# Patient Record
Sex: Male | Born: 1935 | Race: White | Hispanic: No | Smoking: Former smoker
Health system: Southern US, Community
[De-identification: ages and names within clinical notes are randomized; demographics above are authoritative.]

## PROBLEM LIST (undated history)

## (undated) DIAGNOSIS — R238 Other skin changes: Secondary | ICD-10-CM

## (undated) DIAGNOSIS — M1990 Unspecified osteoarthritis, unspecified site: Secondary | ICD-10-CM

## (undated) DIAGNOSIS — R39198 Other difficulties with micturition: Secondary | ICD-10-CM

## (undated) DIAGNOSIS — C801 Malignant (primary) neoplasm, unspecified: Secondary | ICD-10-CM

## (undated) DIAGNOSIS — R059 Cough, unspecified: Secondary | ICD-10-CM

## (undated) DIAGNOSIS — R05 Cough: Secondary | ICD-10-CM

## (undated) DIAGNOSIS — I10 Essential (primary) hypertension: Secondary | ICD-10-CM

## (undated) DIAGNOSIS — R062 Wheezing: Secondary | ICD-10-CM

## (undated) DIAGNOSIS — R1310 Dysphagia, unspecified: Secondary | ICD-10-CM

## (undated) DIAGNOSIS — N189 Chronic kidney disease, unspecified: Secondary | ICD-10-CM

## (undated) DIAGNOSIS — R233 Spontaneous ecchymoses: Secondary | ICD-10-CM

## (undated) DIAGNOSIS — R319 Hematuria, unspecified: Secondary | ICD-10-CM

## (undated) DIAGNOSIS — R0981 Nasal congestion: Secondary | ICD-10-CM

## (undated) HISTORY — DX: Hematuria, unspecified: R31.9

## (undated) HISTORY — PX: LITHOTRIPSY: SUR834

## (undated) HISTORY — PX: OTHER SURGICAL HISTORY: SHX169

## (undated) HISTORY — DX: Cough, unspecified: R05.9

## (undated) HISTORY — DX: Dysphagia, unspecified: R13.10

## (undated) HISTORY — DX: Spontaneous ecchymoses: R23.3

## (undated) HISTORY — DX: Cough: R05

## (undated) HISTORY — DX: Wheezing: R06.2

## (undated) HISTORY — DX: Other skin changes: R23.8

## (undated) HISTORY — DX: Other difficulties with micturition: R39.198

## (undated) HISTORY — DX: Unspecified osteoarthritis, unspecified site: M19.90

## (undated) HISTORY — DX: Nasal congestion: R09.81

---

## 2006-07-21 ENCOUNTER — Encounter: Admission: RE | Admit: 2006-07-21 | Discharge: 2006-07-21 | Payer: Self-pay | Admitting: Urology

## 2006-07-24 ENCOUNTER — Ambulatory Visit (HOSPITAL_BASED_OUTPATIENT_CLINIC_OR_DEPARTMENT_OTHER): Admission: RE | Admit: 2006-07-24 | Discharge: 2006-07-24 | Payer: Self-pay | Admitting: Urology

## 2007-01-22 HISTORY — PX: CYSTOSCOPY W/ URETERAL STENT PLACEMENT: SHX1429

## 2007-08-31 ENCOUNTER — Ambulatory Visit (HOSPITAL_COMMUNITY): Admission: RE | Admit: 2007-08-31 | Discharge: 2007-08-31 | Payer: Self-pay | Admitting: Urology

## 2007-09-10 ENCOUNTER — Ambulatory Visit (HOSPITAL_COMMUNITY): Admission: RE | Admit: 2007-09-10 | Discharge: 2007-09-10 | Payer: Self-pay | Admitting: Urology

## 2010-06-05 NOTE — Op Note (Signed)
NAMENATAN, HARTOG               ACCOUNT NO.:  [id number]   MEDICAL RECORD NO.:  [id number]          PATIENT TYPE:  AMB   LOCATION:  [facility]                         FACILITY:  [facility]   PHYSICIAN:  Excell Seltzer. Annabell Howells, M.D.    DATE OF BIRTH:  Sep 15, 1933   DATE OF PROCEDURE:  07/24/2006  DATE OF DISCHARGE:                               OPERATIVE REPORT   PREOPERATIVE DIAGNOSIS:  Right distal ureteral stone.   POSTOPERATIVE DIAGNOSIS:  Right distal ureteral stone.   PROCEDURE PERFORMED:  1. Cystoscopy.  2. Dilation of ureteral orifice.  3. Right ureteroscopy with laser lithotripsy, basket stone extraction.   SURGEON:  Bjorn Pippin, MD   ASSISTANT:  Tarri Glenn   ANESTHESIA:  General LMA.   DRAINS:  6 x 26 double-J stent with tether.   COMPLICATIONS:  None.   INDICATIONS FOR PROCEDURE:  Mr. Dutter is a 77 year old male with a 6  mm distal right ureteral stone.  He is symptomatic and has failed  conservative management.  He presents for definitive stone management.   DESCRIPTION OF PROCEDURE IN DETAIL:  The patient was brought to the  operating room.  He was identified by his arm band.  Informed consent  was verified, and preoperative time-out was performed.  After the  successful induction of general LMA anesthesia, the patient was moved to  the dorsal lithotomy position.  All appropriate pressure points were  padded to avoid __________ compartment syndrome.  Sequential compression  devices were employed.   Perioperative antibiotics were administered.  The perineum was prepped  and draped.  Surgeon changes gown and gloves.  A 22 French cystoscopic  sheath was used to introduce 12-degree cystoscope instrument directly  into the bladder.  Then 12 and 70 degree lenses were used to perform  pancystoscopic urethroscopy.  Prostatic urethra showed mild bilobar  hypertrophy.  Upon entering the bladder, bilateral ureteral orifices  were noted to be normal anatomic position along the tract.   The left was  seen to efflux clear urine.  The remainder of the bladder was inspected,  and it was free of any mucosal lesions, erythema, foreign body.  There  were several small cellules.  There were no papillary lesions.  We then  turned our attention to the right ureteral orifice.  It was cannulated  with a guidewire.  This was advanced under fluoroscopy into the renal  pelvis.  The cystoscope was removed.  A 12 French access sheath was used  to dilate the ureteral orifice.  We then inserted a semi-rigid  ureteroscope, 6 [place] in caliber.  This was driven under direct vision  into the distal ureter.  There, we visualized a 6 mm stone.  Holmium  laser fiber, 270 microns, was inserted via the working port of the  scope, and laser lithotripsy was carried out with settings at 0.8 joules  at 8 Hz for a power of 6.4 watts.  Once the stone had been fragmented  into 2 approximately 3 mm pieces, the laser was removed.  A nitinol  basket was used to grasp the stone, and each was extracted  and sent for  stone analysis.  The ureteroscope was removed.  A 6 x 26 tethered double-  J stent was advanced over the wire under fluoroscopic control.  It was  constantly seen within the renal pelvis.  The wire was removed.  Excellent proximal and distal curls were seen.  The bladder was drained.  The tether was secured.  At this time, the procedure was terminated.  The patient tolerated the procedure well, and there were no  complications.  Bjorn Pippin was the attending physician and was  participating in all aspects of the procedure.   DISPOSITION:  The patient was awoken uneventfully from anesthesia and  transported safely to the PACU.     ______________________________  Terie Purser, MD      Excell Seltzer. Annabell Howells, M.D.  Electronically Signed    JH/MEDQ  D:  07/24/2006  T:  07/24/2006  Job:  [id number]

## 2010-11-06 LAB — POCT HEMOGLOBIN-HEMACUE
Hemoglobin: 16.3
Operator id: 134391

## 2010-11-07 LAB — BASIC METABOLIC PANEL
BUN: 12
Chloride: 101
GFR calc Af Amer: >60
GFR calc non Af Amer: >60
Potassium: 4.9
Sodium: 140

## 2010-11-26 ENCOUNTER — Encounter (HOSPITAL_COMMUNITY): Payer: Self-pay | Admitting: Pharmacy Technician

## 2010-11-29 NOTE — Progress Notes (Signed)
Pt allergic to [facility] . Omitted per Dr. Annabell Howells

## 2010-12-04 ENCOUNTER — Encounter (HOSPITAL_COMMUNITY): Payer: Self-pay

## 2010-12-04 NOTE — Progress Notes (Addendum)
No aspirin, aleve , or toradol until after litho No fish oil until after litho. Take laxative as instructed in blue folder

## 2010-12-05 NOTE — H&P (Signed)
Problems  1. Asymptomatic Hyperuricemia 790.6 2. Benign Prostatic Hypertrophy With Urinary Obstruction 600.01 3. Microscopic Hematuria 599.72 4. Nephrolithiasis Of Both Kidneys 592.0 5. Organic Impotence 607.84 6. 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 PSA,Elevated 790.93 8. Ureteral Stone 592.1  History of Present Illness  Alex Cherry returns today for right ureteral ESWL.   He was in initially on 8/17 with hematuria and right flank pain and was found to have an 8mm ureteral stone.  He was seen on 9/5 and the stone had moved to the RUVJ.   He some right flank discomfort, but no pain.   A KUB today shows no change in the position of the stone.  He has a large LUP stone that is unchanged.   Past Medical History Problems  1. History of  Abdominal Pain In The Right Lower Belly (RLQ) 789.03 2. History of  Anxiety (Symptom) 300.00 3. History of  Arthritis V13.4 4. History of  Asthma 493.90 5. History of  Esophageal Reflux 530.81 6. History of  Hypercholesterolemia 272.0 7. History of  Hypertension 401.9 8. History of  Nephrolithiasis V13.01 9. History of  Ureteral Stone 592.1 10. History of  Urge Incontinence Of Urine 788.31  Surgical History Problems  1. History of  Cystoscopy With Ureteroscopy Right 2. History of  Hand Surgery 3. History of  Lithotripsy 4. History of  Pilonidal Cyst Resection  Current Meds 1. Aleve TABS; Therapy: (Recorded:27Jun2008) to 2. Alfuzosin HCl ER 10 MG Oral Tablet Extended Release 24 Hour; 1HS - TAKE ONE TABLET BY  MOUTH AT BEDTIME; Therapy: 17Aug2012 to (Last Rx:17Aug2012)  Requested for: 17Aug2012 3. Diltiazem HCl TABS; Therapy: (Recorded:27Jun2008) to 4. Fish Oil CAPS; Therapy: (Recorded:27Jun2008) to 5. Hydrocodone-Acetaminophen 7.5-325 MG Oral Tablet; TAKE 1 TO 2 TABLETS EVERY 4 TO 6  HOURS AS NEEDED FOR PAIN; Therapy: 17Aug2012 to (Evaluate:21Aug2012); Last  Rx:17Aug2012 6. Tylenol CAPS; Therapy: (Recorded:27Jun2008)  to  Allergies Medication  1. Septra SOLN 2. Benadryl CAPS 3. Penicillins  Family History Problems  1. Paternal history of  Acute Myocardial Infarction V17.3 2. Paternal history of  Alcoholism 3. Paternal history of  Death In The Family Father MIAge of death was 38 4. Maternal history of  Death In The Family Mother Lenn Cal of death was 29 5. Family history of  Family Health Status Number Of Children One son and one daughter  Social History Problems  1. Alcohol Use Socially 2. Marital History - Currently Married 3. Retired From Work 4. History of  Tobacco Use V15.82 Denied  5. History of  Caffeine Use  Review of Systems  Gastrointestinal: no nausea.  Constitutional: no fever.    Vitals Vital Signs [Data Includes: Last 1 Day]  10Oct2012 08:24AM  Blood Pressure: 149 / 78 Temperature: 97.5 F Heart Rate: 60  Physical Exam Constitutional: Well nourished and well developed . No acute distress.  Pulmonary: No respiratory distress and normal respiratory rhythm and effort.  Cardiovascular: Heart rate and rhythm are normal . No peripheral edema.    Results/Data Urine [Data Includes: Last 1 Day]  10Oct2012  COLOR: YELLOW  Reference Range YELLOW APPEARANCE: CLEAR  Reference Range CLEAR SPECIFIC GRAVITY: 1.030  Reference Range 1.005-1.030 pH: 5.5  Reference Range 5.0-8.0 GLUCOSE: NEG mg/dL Reference Range NEG BILIRUBIN: NEG  Reference Range NEG KETONE: NEG mg/dL Reference Range NEG BLOOD: LARGE  Abnormal Reference Range NEG PROTEIN: NEG mg/dL Reference Range NEG UROBILINOGEN: 0.2 mg/dL Reference Range 1.6-1.0 NITRITE: NEG  Reference Range NEG LEUKOCYTE ESTERASE: NEG  Reference  Range NEG SQUAMOUS EPITHELIAL/HPF: RARE  Reference Range RARE WBC: 4-6 WBC/hpf Abnormal Reference Range <4 RBC: 7-10 RBC/hpf Abnormal Reference Range <4 BACTERIA: RARE  Reference Range RARE CRYSTALS: NONE SEEN  Reference Range NEG CASTS: NONE SEEN  Reference Range NEG  The  following images/tracing/specimen were independently visualized:  KUB today shows a stable RUVJ stone and a stable LUP stone. There are no other changes in the films.    Assessment Assessed  1. Ureteral Stone 592.1 2. Nephrolithiasis Of Both Kidneys 592.0   His stone hasn't moved in the last 5 weeks.   Plan Health Maintenance (V70.0)  1. UA With REFLEX  Done: 10Oct2012 08:09AM   ESWL today for his RUVJ stone.   I reviewed the risks including but not limited to bleeding, infection, injury to adjacent structures, failure of the procedure, thrombotic events and sedation risks.  He probability of success is about 75-80%.   Discussion/Summary  CC: Dr. Windle Guard.

## 2010-12-06 ENCOUNTER — Encounter (HOSPITAL_COMMUNITY)
Admission: RE | Disposition: A | Discharge status: Home / Self Care | Payer: Self-pay | Source: Ambulatory Visit | Attending: Urology

## 2010-12-06 ENCOUNTER — Ambulatory Visit (HOSPITAL_COMMUNITY): Payer: Medicare Other

## 2010-12-06 ENCOUNTER — Encounter (HOSPITAL_COMMUNITY): Payer: Self-pay | Admitting: *Deleted

## 2010-12-06 ENCOUNTER — Ambulatory Visit (HOSPITAL_COMMUNITY)
Admission: RE | Admit: 2010-12-06 | Discharge: 2010-12-06 | Disposition: A | Discharge status: Home / Self Care | Payer: Medicare Other | Source: Ambulatory Visit | Attending: Urology | Admitting: Urology

## 2010-12-06 DIAGNOSIS — N201 Calculus of ureter: Secondary | ICD-10-CM | POA: Diagnosis present

## 2010-12-06 DIAGNOSIS — J45909 Unspecified asthma, uncomplicated: Secondary | ICD-10-CM | POA: Insufficient documentation

## 2010-12-06 DIAGNOSIS — Z79899 Other long term (current) drug therapy: Secondary | ICD-10-CM | POA: Insufficient documentation

## 2010-12-06 DIAGNOSIS — I10 Essential (primary) hypertension: Secondary | ICD-10-CM | POA: Insufficient documentation

## 2010-12-06 HISTORY — DX: Essential (primary) hypertension: I10

## 2010-12-06 SURGERY — LITHOTRIPSY, ESWL
Anesthesia: LOCAL | Laterality: Right

## 2010-12-06 MED ORDER — DEXTROSE-NACL 5-0.45 % IV SOLN
INTRAVENOUS | Status: DC
  Administered 2010-12-06: 07:00:00 via INTRAVENOUS

## 2010-12-06 MED ORDER — HYDROCODONE-ACETAMINOPHEN 5-325 MG PO TABS: 1.0000 | ORAL_TABLET | ORAL | Status: AC | PRN

## 2010-12-06 MED ORDER — CIPROFLOXACIN HCL 500 MG PO TABS
500.0000 mg | ORAL_TABLET | Freq: Once | ORAL | Status: AC
  Administered 2010-12-06: 500 mg via ORAL

## 2010-12-06 MED ORDER — DIAZEPAM 5 MG PO TABS
10.0000 mg | ORAL_TABLET | Freq: Once | ORAL | Status: AC
  Administered 2010-12-06: 10 mg via ORAL

## 2010-12-06 NOTE — Progress Notes (Signed)
To Litho truck via w/c

## 2010-12-06 NOTE — Brief Op Note (Signed)
12/06/2010  9:21 AM  PATIENT:  Alex Cherry  77 y.o. male  PRE-OPERATIVE DIAGNOSIS:  right distal stone  POST-OPERATIVE DIAGNOSIS:right distal stone PROCEDURE:  Procedure(s): EXTRACORPOREAL SHOCK WAVE LITHOTRIPSY (ESWL)  SURGEON:  Surgeon(s): Anner Crete  PHYSICIAN ASSISTANT:   ASSISTANTS: none   ANESTHESIA:   IV sedation  EBL:     BLOOD ADMINISTERED:none  DRAINS: none   LOCAL MEDICATIONS USED:  NONE  SPECIMEN:  No Specimen  DISPOSITION OF SPECIMEN:  N/A  COUNTS:  YES  TOURNIQUET:  * No tourniquets in log *  DICTATION: .Note written in paper chart  PLAN OF CARE: Discharge to home after PACU  PATIENT DISPOSITION:  PACU - hemodynamically stable.   Delay start of Pharmacological VTE agent (>24hrs) due to surgical blood loss or risk of bleeding:  {YES/NO/NOT APPLICABLE:20182

## 2010-12-06 NOTE — Progress Notes (Signed)
Denies aspirin or ibuprofen in more than 3 days

## 2010-12-06 NOTE — Interval H&P Note (Signed)
History and Physical Interval Note:   12/06/2010   7:26 AM   Alex Cherry  has presented today for surgery, with the diagnosis of right distal stone  The various methods of treatment have been discussed with the patient and family. After consideration of risks, benefits and other options for treatment, the patient has consented to  Procedure(s): EXTRACORPOREAL SHOCK WAVE LITHOTRIPSY (ESWL) as a surgical intervention .  The patients' history has been reviewed, patient examined, no change in status, stable for surgery.  I have reviewed the patients' chart and labs.  Questions were answered to the patient's satisfaction.     Anner Crete  MD

## 2011-06-04 ENCOUNTER — Ambulatory Visit (INDEPENDENT_AMBULATORY_CARE_PROVIDER_SITE_OTHER): Payer: Medicare Other | Admitting: Surgery

## 2011-06-12 ENCOUNTER — Other Ambulatory Visit (INDEPENDENT_AMBULATORY_CARE_PROVIDER_SITE_OTHER): Payer: Self-pay | Admitting: Surgery

## 2011-06-12 ENCOUNTER — Encounter (INDEPENDENT_AMBULATORY_CARE_PROVIDER_SITE_OTHER): Payer: Self-pay | Admitting: Surgery

## 2011-06-12 ENCOUNTER — Ambulatory Visit (INDEPENDENT_AMBULATORY_CARE_PROVIDER_SITE_OTHER): Payer: Medicare Other | Admitting: Surgery

## 2011-06-12 VITALS — BP 152/80 | HR 64 | Temp 97.2°F | Resp 14 | Ht 68.0 in | Wt 223.4 lb

## 2011-06-12 DIAGNOSIS — E210 Primary hyperparathyroidism: Secondary | ICD-10-CM | POA: Insufficient documentation

## 2011-06-12 NOTE — Patient Instructions (Signed)
Parathyroidectomy A parathyroidectomy is surgery to remove one or more parathyroid glands. These glands produce a hormone (parathyroid hormone) that helps control the level of calcium in your body. The glands are very small, about the size of a pea. They are located in your neck, close to your thyroid gland and your Adam's apple. Most people (85%) have four parathyroid glands,some people may have one or two more than that. Hyperparathyroidism is when too much parathyroid hormone is being produced. Usually this is caused by one of the parathyroid glands becoming enlarged, but it can also be caused by more than one of the glands. Hyperparathyroidism is found during blood tests that show high calcium in the blood. Parathyroid hormone levels will also be elevated. Cancer also can cause hyperparathyroidism, but this is rare. For the most common type of hyperparathyroidism, the treatment is surgical removal of the parathyroid gland that is enlarged. For patients with kidney failure and hyperparathyroidism, other treatment will be tried before surgery is done on the parathyroid.  Many times x-ray studies are done to find out which parathyroid gland or glands is malfunctioning. The decision about the best treatment for hyperparathyroidism is between the patient, their primary doctor, an endocrinologist, and a surgeon experienced in parathyroid surgery. LET YOUR CAREGIVER KNOW ABOUT:  Any allergies.   All medications you are taking, including:   Herbs, eyedrops, over-the-counter medications and creams.   Blood thinners (anticoagulants), aspirin or other drugs that could affect blood clotting.   Use of steroids (by mouth or as creams).   Previous problems with anesthetics, including local anesthetics.   Possibility of pregnancy, if this applies.   Any history of blood clots.   Any history of bleeding or other blood problems.   Previous surgery.   Smoking history.   Other health problems.  RISKS  AND COMPLICATIONS   Short-term possibilities include:   Excessive bleeding.   Pain.   Infection near the incision.   Slow healing.   Pooling of blood under the wound (hematoma).   Damage to nerves in your neck.   Blood clots.   Difficulty breathing. This is very rare. It also is almost always temporary.   Longer-term possibilities include:   Scarring.   Skin damage.   Damage to blood vessels in the area.   Need for additional surgery.   A hoarse or weak voice. This is usually temporary. It can be the result of nerve damage.   Development of hypoparathyroidism. This means you are not making enough parathyroid hormone. It is rare. If it occurs, you will need to take calcium supplements daily.  BEFORE THE PROCEDURE  Sometimes the surgery is done on an outpatient basis. This means you could go home the same day as your surgery. Other times, people need to stay in the hospital overnight. Ask your surgeon what you should expect.   If your surgery will be an outpatient procedure, arrange for someone to drive you home after the surgery.   Two weeks before your surgery, stop using aspirin and non-steroidal anti-inflammatory drugs (NSAID's) for pain relief. This includes prescription drugs and over-the-counter drugs such as ibuprofen and naproxen. Also stop taking vitamin E.   If you take blood-thinners, ask your healthcare provider when you should stop taking them.   Do not eat or drink for about 8 hours before your surgery.   You might be asked to shower or wash with a special antibacterial soap before the procedure.   Arrive at least an hour before the   surgery, or whenever your surgeon recommends. This will give you time to check in and fill out any needed paperwork.  PROCEDURE  The preparation:   You will change into a hospital gown.   You will be given an IV. A needle will be inserted in your arm. Medication will be able to flow directly into your body through this  needle.   You might be given a sedative to help you relax.   You will be given a drug that puts you to sleep during the surgery (general anesthetic).   The procedure:   Once you are asleep, the surgeon will make a small cut (incision) in your lower neck. Ask your surgeon where the incision will be.   The surgeon will look for the gland(s) that are not working well. Often a tissue sample from a gland is used to determine this.   Any glands that are not working well will be removed.   The surgeon will close the incision with stitches, often these are hidden under the skin.  AFTER THE PROCEDURE  You will stay in a recovery area until the anesthesia has worn off. Your blood pressure and heart rate will be checked.   If your surgery was an outpatient procedure, you will go home the same day.   If you need to stay in the hospital, you will be moved to a hospital room. You will probably stay for two to three days. This will depend on how quickly you recover.   While you are in the hospital, your blood will be tested to check the calcium levels in your body.  HOME CARE INSTRUCTIONS   Take any medication that your surgeon prescribes. Follow the directions carefully. Take all of the medication.   Ask your surgeon whether you can take over-the-counter medicines for pain, discomfort or fever. Do not take aspirin unless your healthcare provider says to. Aspirin increases the chances of bleeding.   Do not get the wound wet for the first few days after surgery (or until the surgeon tells you it is OK).  SEEK MEDICAL CARE IF:   You notice blood or fluid leaking from the wound, or it becomes red or swollen.   You have trouble breathing.   You have trouble speaking.   You become nauseous or throw up for more than two days after the surgery.   You develop a fever of more than 100.5 F (38.1 C).  SEEK IMMEDIATE MEDICAL CARE IF:   Breathing becomes more difficult.   You develop a fever of  102.0 F (38.9 C) or higher.  Document Released: 04/05/2008 Document Revised: 12/27/2010 Document Reviewed: 04/05/2008 [employer] Patient Information 2012 [employer]. 

## 2011-06-12 NOTE — Progress Notes (Signed)
Chief Complaint  Patient presents with  . New Evaluation    evaluate for hyperparathyroidism - referral from Dr. Bjorn Pippin   HISTORY: Patient is a 78 year old white male referred by his urologist for evaluation for possible primary hyperparathyroidism. Patient has a history of nephrolithiasis dating back 10 years. Recent laboratory studies show a normal serum calcium level of 10.8 but an elevated intact PTH level of 100.9. Patient has had no further diagnostic studies.  Patient has a long-standing history of nephrolithiasis. He complains of hip pain. He notes approximately a 30 pound weight loss over the past one year. He notes chronic fatigue and he notes recent development of muscle weakness. Patient has had no prior surgery on the head or neck. He has no history of other endocrinopathy. There is no family history of endocrinopathy.  Past Medical History  Diagnosis Date  . Asthma     as a child  . Hypertension     Denies any other heart  conditions, no chest pain or arrtthy.  . Arthritis   . Hearing loss   . Nasal congestion   . Trouble swallowing   . Cough   . Wheezing   . Constipation   . Difficulty urinating   . Blood in urine   . Easy bruising   . Hyperparathyroidism      Current Outpatient Prescriptions  Medication Sig Dispense Refill  . acetaminophen (TYLENOL) 500 MG tablet Take 500 mg by mouth every 6 (six) hours as needed. PAIN        . diltiazem (CARDIZEM CD) 180 MG 24 hr capsule Take 180 mg by mouth every morning.        . fish oil-omega-3 fatty acids 1000 MG capsule Take 1 g by mouth daily.        Marland Kitchen glycerin adult (GLYCERIN ADULT) 2 G SUPP Place 1 suppository rectally once as needed. CONSTIPATION       . naproxen sodium (ANAPROX) 220 MG tablet Take 220 mg by mouth every morning.        Bertram Gala Glycol-Propyl Glycol (SYSTANE) 0.4-0.3 % SOLN Place 1-2 drops into both eyes daily as needed. DRY EYES       . sodium chloride (OCEAN) 0.65 % nasal spray Place 1 spray  into the nose as needed. ALLERGIES           Allergies  Allergen Reactions  . Diphenhydramine Hives, Itching and Nausea Only    All over the body   . Penicillins Hives, Itching and Nausea Only    All over the body  . Septra (Sulfamethoxazole W/Trimethoprim (Co-Trimoxazole)) Hives, Itching and Nausea Only    All over the body     Family History  Problem Relation Age of Onset  . Cancer Sister      History   Social History  . Marital Status: Married    Spouse Name: N/A    Number of Children: N/A  . Years of Education: N/A   Social History Main Topics  . Smoking status: Former Smoker -- 50 years    Types: Cigarettes    Quit date: 12/03/1993  . Smokeless tobacco: None  . Alcohol Use: Yes  . Drug Use: No  . Sexually Active: None   Other Topics Concern  . None   Social History Narrative  . None     REVIEW OF SYSTEMS - PERTINENT POSITIVES ONLY: Nephrolithiasis, bone and joint pain, weight loss, fatigue, muscle weakness  EXAM: Filed Vitals:   06/12/11 0934  BP:  152/80  Pulse: 64  Temp: 97.2 F (36.2 C)  Resp: 14    HEENT: normocephalic; pupils equal and reactive; sclerae clear; dentition good; mucous membranes moist NECK:  symmetric on extension; no palpable anterior or posterior cervical lymphadenopathy; no supraclavicular masses; no tenderness CHEST: clear to auscultation bilaterally without rales, rhonchi, or wheezes CARDIAC: regular rate and rhythm without significant murmur; peripheral pulses are full EXT:  non-tender without edema; no deformity NEURO: no gross focal deficits; no sign of tremor   LABORATORY RESULTS: See [facility] ([facility]pic) for most recent results   RADIOLOGY RESULTS: See [facility] ([facility]L-Epic) for most recent results   IMPRESSION: #1 long-standing history of nephrolithiasis #2 elevated intact parathyroid hormone level, rule out primary hyperparathyroidism  PLAN:  Velora Heckler, MD, FACSThe patient and I  discussed the above findings and issues. I explained to him that he does not definitely have a diagnosis of primary hyperparathyroidism. However he has an inappropriately elevated intact PTH level with a normal serum calcium level. We will obtain further studies including a vitamin D level, a repeat of his calcium and intact PTH levels, a magnesium level, and a 24-hour urine collection for calcium. Patient will also undergo a nuclear medicine parathyroid scan. I will contact him with these results. If the nuclear scan is unrevealing but the biochemical tests indicate possible primary hyperparathyroidism, then we will obtain an MRI scan of the neck.  Patient will return once these studies are completed to discuss the possibility of surgical exploration of the neck.  Velora Heckler, MD, [facility] Office: [phone]   Visit Diagnoses: 1. Hyperparathyroidism, primary     Primary Care Physician: Kaleen Mask, MD, MD  Urology:  Dr. Bjorn Pippin

## 2011-06-12 NOTE — Progress Notes (Signed)
Addended by: Joanette Gula on: 06/12/2011 10:18 AM   Modules accepted: Orders

## 2011-06-13 LAB — VITAMIN D 25 HYDROXY (VIT D DEFICIENCY, FRACTURES): Vit D, 25-Hydroxy: 21 ng/mL — ABNORMAL LOW (ref 30–89)

## 2011-06-13 LAB — PTH, INTACT AND CALCIUM
Calcium, Total (PTH): 9.9 mg/dL (ref 8.4–10.5)
PTH: 112.1 pg/mL — ABNORMAL HIGH (ref 14.0–72.0)

## 2011-06-13 LAB — MAGNESIUM: Magnesium: 2.1 mg/dL (ref 1.5–2.5)

## 2011-06-14 ENCOUNTER — Other Ambulatory Visit (INDEPENDENT_AMBULATORY_CARE_PROVIDER_SITE_OTHER): Payer: Self-pay | Admitting: Surgery

## 2011-06-15 LAB — CALCIUM, URINE, 24 HOUR: Calcium, Ur: 20 mg/dL

## 2011-06-20 ENCOUNTER — Encounter (HOSPITAL_COMMUNITY): Payer: Self-pay

## 2011-06-20 ENCOUNTER — Ambulatory Visit (HOSPITAL_COMMUNITY)
Admission: RE | Admit: 2011-06-20 | Discharge: 2011-06-20 | Disposition: A | Discharge status: Home / Self Care | Payer: Medicare Other | Source: Ambulatory Visit | Attending: Surgery | Admitting: Surgery

## 2011-06-20 ENCOUNTER — Encounter (HOSPITAL_COMMUNITY)
Admission: RE | Admit: 2011-06-20 | Discharge: 2011-06-20 | Disposition: A | Discharge status: Home / Self Care | Payer: Medicare Other | Source: Ambulatory Visit | Attending: Surgery | Admitting: Surgery

## 2011-06-20 DIAGNOSIS — E210 Primary hyperparathyroidism: Secondary | ICD-10-CM

## 2011-06-20 MED ORDER — TECHNETIUM TC 99M SESTAMIBI - CARDIOLITE
25.3000 | Freq: Once | INTRAVENOUS | Status: AC | PRN
  Administered 2011-06-20: 25.3 via INTRAVENOUS

## 2011-07-01 ENCOUNTER — Telehealth (INDEPENDENT_AMBULATORY_CARE_PROVIDER_SITE_OTHER): Payer: Self-pay | Admitting: General Surgery

## 2011-07-01 NOTE — Telephone Encounter (Signed)
Unable to reach pt on cell phone it has been d/c. Home phone does not answer. Let phone ring 12 times. Dr Gerrit Friends has results to review and I  Am waiting on response.

## 2011-07-01 NOTE — Telephone Encounter (Signed)
Pt calling for test results from end of May.  Home phone has answering machine, but okay to try cell phone, too.

## 2011-07-02 ENCOUNTER — Telehealth (INDEPENDENT_AMBULATORY_CARE_PROVIDER_SITE_OTHER): Payer: Self-pay

## 2011-07-02 NOTE — Telephone Encounter (Signed)
Patient called in requesting test results--I did tell patient that Dr. Ardine Eng nurse tried to return his call yesterday but, the number was d/c.  Read cell number back to patient and the number we had on file was incorrect.  Correct phone number has been changed in [facility] [phone].  Patient is aware that results are pending Dr. Ardine Eng review, will call once they are rec'd.

## 2011-07-04 ENCOUNTER — Telehealth (INDEPENDENT_AMBULATORY_CARE_PROVIDER_SITE_OTHER): Payer: Self-pay

## 2011-07-04 NOTE — Telephone Encounter (Signed)
Patient calling for diagnostic & lab results.  Dr. Gerrit Friends paged.

## 2011-07-04 NOTE — Telephone Encounter (Signed)
Pt advise scan was negative and I review labs with pt. I advised pt these will still need to be reviewed by Dr Gerrit Friends. Pt advised Dr Gerrit Friends has been the hospitalist this week and it has delayed his ability to review non urgent tests.  Pt states if he needs and neck mri  he will not tolerate this test. Pt advised I will share this also with Dr Gerrit Friends and will call the pt as soon as I receive recommendations.

## 2011-07-11 ENCOUNTER — Other Ambulatory Visit (INDEPENDENT_AMBULATORY_CARE_PROVIDER_SITE_OTHER): Payer: Self-pay | Admitting: Surgery

## 2011-07-11 ENCOUNTER — Telehealth (INDEPENDENT_AMBULATORY_CARE_PROVIDER_SITE_OTHER): Payer: Self-pay | Admitting: Surgery

## 2011-07-11 DIAGNOSIS — E210 Primary hyperparathyroidism: Secondary | ICD-10-CM

## 2011-07-11 MED ORDER — ERGOCALCIFEROL 1.25 MG (50000 UT) PO CAPS: 50000.0000 [IU] | ORAL_CAPSULE | ORAL | Status: DC

## 2011-07-11 NOTE — Telephone Encounter (Signed)
Called patient and reviewed results of lab tests, sestamibi scan, and 24 hour urine test for calcium.  Offered MRI scan of neck (patient refuses), Vit D replacement (prescription strength) for 3 months, or surgical exploration (patient refuses at this time).  Will prescribe ergocalciferol 50,000 IU weekly for 3 months.  Will then check PTH, calcium, and Vit D levels and see patient in office.  Velora Heckler, MD, [facility] Office: [phone]

## 2011-07-22 ENCOUNTER — Telehealth (INDEPENDENT_AMBULATORY_CARE_PROVIDER_SITE_OTHER): Payer: Self-pay

## 2011-07-22 NOTE — Telephone Encounter (Signed)
Labs slips and appt card with sept appt date mailed to pt.

## 2011-08-06 ENCOUNTER — Encounter (INDEPENDENT_AMBULATORY_CARE_PROVIDER_SITE_OTHER): Payer: Self-pay

## 2011-09-24 ENCOUNTER — Other Ambulatory Visit (INDEPENDENT_AMBULATORY_CARE_PROVIDER_SITE_OTHER): Payer: Self-pay | Admitting: Surgery

## 2011-09-25 LAB — PTH, INTACT AND CALCIUM: Calcium, Total (PTH): 10.1 mg/dL (ref 8.4–10.5)

## 2011-10-14 ENCOUNTER — Ambulatory Visit (INDEPENDENT_AMBULATORY_CARE_PROVIDER_SITE_OTHER): Payer: Medicare Other | Admitting: Surgery

## 2011-10-21 ENCOUNTER — Other Ambulatory Visit (INDEPENDENT_AMBULATORY_CARE_PROVIDER_SITE_OTHER): Payer: Self-pay

## 2011-10-21 ENCOUNTER — Ambulatory Visit (INDEPENDENT_AMBULATORY_CARE_PROVIDER_SITE_OTHER): Payer: Medicare Other | Admitting: Surgery

## 2011-10-21 ENCOUNTER — Encounter (INDEPENDENT_AMBULATORY_CARE_PROVIDER_SITE_OTHER): Payer: Self-pay | Admitting: Surgery

## 2011-10-21 VITALS — BP 142/70 | HR 68 | Temp 98.0°F | Resp 18 | Ht 67.0 in | Wt 222.0 lb

## 2011-10-21 DIAGNOSIS — E041 Nontoxic single thyroid nodule: Secondary | ICD-10-CM

## 2011-10-21 DIAGNOSIS — E213 Hyperparathyroidism, unspecified: Secondary | ICD-10-CM

## 2011-10-21 DIAGNOSIS — E210 Primary hyperparathyroidism: Secondary | ICD-10-CM

## 2011-10-21 MED ORDER — ERGOCALCIFEROL 1.25 MG (50000 UT) PO CAPS: 50000.0000 [IU] | ORAL_CAPSULE | ORAL | Status: AC

## 2011-10-21 NOTE — Progress Notes (Signed)
General Surgery [facility]  Visit Diagnoses: 1. Hyperparathyroidism, primary     HISTORY: Patient is a 78 year old white male evaluated in May 2013 for suspected parathyroid disease. Patient continues to have normal serum calcium levels in the face of an elevated intact PTH level. Patient was deficient in vitamin D and has been taking 50,000 units weekly. His level now is in the low normal range at 33. Other laboratory studies include a serum calcium level which is normal at 10.1. He is intact PTH level has improved and remains minimally elevated at 75.4.  PERTINENT REVIEW OF SYSTEMS: Patient has noted improvement in muscle strength and energy level since taking vitamin D supplements.  EXAM: HEENT: normocephalic; pupils equal and reactive; sclerae clear; dentition good; mucous membranes moist NECK:  symmetric on extension; no palpable anterior or posterior cervical lymphadenopathy; no supraclavicular masses; no tenderness CHEST: clear to auscultation bilaterally without rales, rhonchi, or wheezes CARDIAC: regular rate and rhythm without significant murmur; peripheral pulses are full EXT:  non-tender without edema; no deformity NEURO: no gross focal deficits; no sign of tremor   IMPRESSION: #1 rule out primary hyperparathyroidism #2 history of nephrolithiasis #3 rule out thyroid nodules  PLAN: I discussed the above findings with the patient. Again his calcium level remains within the normal range despite an elevated intact PTH level. I am going to repeat his PTH level at a different laboratory for comparison purposes. I will contact him with those results.  Patient will continue on vitamin D supplements, 50,000 units weekly, for an additional 3 months. We will check his vitamin D level prior to his next office visit in 6 months.  Patient does have some slight asymmetry of the neck on examination. I am not certain that I can palpate a thyroid nodule. He does have a  family history of thyroid nodules in his sister. Therefore we will obtain a diagnostic thyroid ultrasound to look for possible thyroid nodules and also a possible parathyroid adenoma, although I believe this is unlikely to be found on ultrasound examination.  Patient will return in 6 months for followup.  Velora Heckler, MD, [facility] Office: [phone]

## 2011-10-22 LAB — PTH, INTACT AND CALCIUM: PTH: 64 pg/mL (ref 15–65)

## 2011-10-23 ENCOUNTER — Telehealth (INDEPENDENT_AMBULATORY_CARE_PROVIDER_SITE_OTHER): Payer: Self-pay | Admitting: Surgery

## 2011-10-23 DIAGNOSIS — E210 Primary hyperparathyroidism: Secondary | ICD-10-CM

## 2011-10-23 NOTE — Telephone Encounter (Signed)
Patient had laboratory studies were repeated at [facility] at my request.  Calcium level remains at the upper limit of normal at 10.5. The intact PTH level is just within the normal range at 64.  Patient will undergo ultrasound of the neck later this week. I will review those results when they are available.  Patient will continue on vitamin D supplementation for the next 3 months. I plan to repeat his laboratory studies in 6 months and see him back in the office at that time. There is no indication for surgical intervention at this point.  Velora Heckler, MD, [facility] Office: [phone]

## 2011-10-25 ENCOUNTER — Ambulatory Visit
Admission: RE | Admit: 2011-10-25 | Discharge: 2011-10-25 | Disposition: A | Discharge status: Home / Self Care | Payer: Medicare Other | Source: Ambulatory Visit | Attending: Surgery | Admitting: Surgery

## 2011-10-25 DIAGNOSIS — E041 Nontoxic single thyroid nodule: Secondary | ICD-10-CM

## 2011-10-25 DIAGNOSIS — E213 Hyperparathyroidism, unspecified: Secondary | ICD-10-CM

## 2011-10-28 ENCOUNTER — Telehealth (INDEPENDENT_AMBULATORY_CARE_PROVIDER_SITE_OTHER): Payer: Self-pay

## 2011-10-28 ENCOUNTER — Other Ambulatory Visit (INDEPENDENT_AMBULATORY_CARE_PROVIDER_SITE_OTHER): Payer: Self-pay

## 2011-10-28 DIAGNOSIS — E042 Nontoxic multinodular goiter: Secondary | ICD-10-CM

## 2011-10-28 NOTE — Telephone Encounter (Signed)
Per Dr Ardine Eng request pt notified of u/s result and to have repeat u/s in 6 mo. Order placed in epic.

## 2012-03-25 ENCOUNTER — Telehealth (INDEPENDENT_AMBULATORY_CARE_PROVIDER_SITE_OTHER): Payer: Self-pay | Admitting: General Surgery

## 2012-03-25 NOTE — Telephone Encounter (Signed)
Spoke with patient he will make appt with [facility] in April then call into office for f/u office visit . A letter was mailed to the patient as well per his request .

## 2012-04-07 ENCOUNTER — Ambulatory Visit
Admission: RE | Admit: 2012-04-07 | Discharge: 2012-04-07 | Disposition: A | Discharge status: Home / Self Care | Payer: Medicare Other | Source: Ambulatory Visit | Attending: Surgery | Admitting: Surgery

## 2012-04-07 ENCOUNTER — Telehealth (INDEPENDENT_AMBULATORY_CARE_PROVIDER_SITE_OTHER): Payer: Self-pay

## 2012-04-07 ENCOUNTER — Other Ambulatory Visit (INDEPENDENT_AMBULATORY_CARE_PROVIDER_SITE_OTHER): Payer: Self-pay

## 2012-04-07 DIAGNOSIS — E213 Hyperparathyroidism, unspecified: Secondary | ICD-10-CM

## 2012-04-07 NOTE — Telephone Encounter (Signed)
Pt advised labs are due. Lab slip for [facility] mailed to pt.

## 2012-05-07 ENCOUNTER — Ambulatory Visit (INDEPENDENT_AMBULATORY_CARE_PROVIDER_SITE_OTHER): Payer: Medicare Other | Admitting: Surgery

## 2012-05-07 ENCOUNTER — Encounter (INDEPENDENT_AMBULATORY_CARE_PROVIDER_SITE_OTHER): Payer: Self-pay | Admitting: Surgery

## 2012-05-07 VITALS — BP 142/78 | HR 70 | Temp 97.2°F | Resp 16 | Ht 67.5 in | Wt 225.2 lb

## 2012-05-07 DIAGNOSIS — E210 Primary hyperparathyroidism: Secondary | ICD-10-CM

## 2012-05-07 DIAGNOSIS — E042 Nontoxic multinodular goiter: Secondary | ICD-10-CM | POA: Insufficient documentation

## 2012-05-07 NOTE — Progress Notes (Signed)
General Surgery [facility]  Visit Diagnoses: 1. Hyperparathyroidism, primary   2. Multiple thyroid nodules     HISTORY: Patient returns for schedule followup. He has been followed for elevated intact PTH levels and vitamin D deficiency. He has also been undergoing workup for bilateral thyroid nodules.  At my request the patient underwent a thyroid ultrasound in March 2014. This shows a stable thyroid gland with mild heterogeneity. There are bilateral nodules. These were all stable in size and character. The dominant nodule in the left lobe measured 1.5 cm.  Laboratory studies from 04/21/2012 shows a serum calcium level of 10.0 and an intact PTH level of 44, both within the normal range.  Patient continues to take vitamin D 50,000 units every 2 weeks under the direction of his primary care physician.  PERTINENT REVIEW OF SYSTEMS: Denies tremor. Denies palpitations. Denies compressive symptoms. Denies pain. Denies mass.  EXAM: HEENT: normocephalic; pupils equal and reactive; sclerae clear; dentition fair; mucous membranes moist NECK:  Subtle nodularity of both lobes of the thyroid gland without dominant or discrete mass; symmetric on extension; no palpable anterior or posterior cervical lymphadenopathy; no supraclavicular masses; no tenderness CHEST: clear to auscultation bilaterally without rales, rhonchi, or wheezes CARDIAC: regular rate and rhythm without significant murmur; peripheral pulses are full EXT:  non-tender without edema; no deformity NEURO: no gross focal deficits; no sign of tremor   IMPRESSION: #1 normalization of calcium level and intact PTH level with vitamin D therapy #2 multiple thyroid nodules, clinically stable  PLAN: Patient and I discussed his ultrasound results and his laboratory results. At this point in time there is no indication for surgical intervention or further workup. I will ask the patient to return in one year. We will repeat  his thyroid ultrasound prior to that office visit. We will also obtain laboratory work to include an intact PTH level, a serum calcium level, a TSH level, and a 25 hydroxy vitamin D level. We will review these at his next appointment.  Velora Heckler, MD, [facility] Office: [phone]

## 2012-05-07 NOTE — Patient Instructions (Signed)

## 2012-05-13 ENCOUNTER — Encounter (INDEPENDENT_AMBULATORY_CARE_PROVIDER_SITE_OTHER): Payer: Self-pay

## 2013-04-19 ENCOUNTER — Ambulatory Visit (HOSPITAL_COMMUNITY)
Admission: RE | Admit: 2013-04-19 | Discharge: 2013-04-19 | Disposition: A | Discharge status: Home / Self Care | Payer: Medicare Other | Source: Ambulatory Visit | Attending: Family Medicine | Admitting: Family Medicine

## 2013-04-19 ENCOUNTER — Other Ambulatory Visit (HOSPITAL_COMMUNITY): Payer: Self-pay | Admitting: Family Medicine

## 2013-04-19 DIAGNOSIS — M79604 Pain in right leg: Secondary | ICD-10-CM

## 2013-04-19 DIAGNOSIS — M7989 Other specified soft tissue disorders: Secondary | ICD-10-CM

## 2013-04-19 NOTE — Progress Notes (Signed)
*  Preliminary Results* Right lower extremity venous duplex completed. Right lower extremity is negative for deep vein thrombosis. There is no evidence of right Baker's cyst.  Preliminary results discussed with Jeani Hawking of Dr.Elkins' office.  04/19/2013 3:34 PM  Maudry Mayhew, RVT, RDCS, RDMS

## 2013-04-22 ENCOUNTER — Ambulatory Visit
Admission: RE | Admit: 2013-04-22 | Discharge: 2013-04-22 | Disposition: A | Discharge status: Home / Self Care | Payer: Medicare Other | Source: Ambulatory Visit | Attending: Surgery | Admitting: Surgery

## 2013-04-22 DIAGNOSIS — E042 Nontoxic multinodular goiter: Secondary | ICD-10-CM

## 2013-04-22 DIAGNOSIS — E210 Primary hyperparathyroidism: Secondary | ICD-10-CM

## 2013-04-27 ENCOUNTER — Encounter (INDEPENDENT_AMBULATORY_CARE_PROVIDER_SITE_OTHER): Payer: Self-pay | Admitting: Surgery

## 2013-05-13 ENCOUNTER — Other Ambulatory Visit (INDEPENDENT_AMBULATORY_CARE_PROVIDER_SITE_OTHER): Payer: Self-pay | Admitting: Surgery

## 2013-05-13 ENCOUNTER — Encounter (INDEPENDENT_AMBULATORY_CARE_PROVIDER_SITE_OTHER): Payer: Self-pay | Admitting: Surgery

## 2013-05-13 ENCOUNTER — Ambulatory Visit (INDEPENDENT_AMBULATORY_CARE_PROVIDER_SITE_OTHER): Payer: Medicare Other | Admitting: Surgery

## 2013-05-13 VITALS — BP 118/70 | HR 72 | Temp 97.0°F | Resp 14 | Ht 67.0 in | Wt 235.0 lb

## 2013-05-13 DIAGNOSIS — E042 Nontoxic multinodular goiter: Secondary | ICD-10-CM

## 2013-05-13 DIAGNOSIS — E210 Primary hyperparathyroidism: Secondary | ICD-10-CM

## 2013-05-13 DIAGNOSIS — E041 Nontoxic single thyroid nodule: Secondary | ICD-10-CM

## 2013-05-13 NOTE — Progress Notes (Signed)
General Surgery [facility]  Chief Complaint  Patient presents with  . Follow-up    bilateral thyroid nodules, hypercalcemia    HISTORY: Patient is a 80 year old male followed for bilateral thyroid nodules and for borderline hypercalcemia. Patient was last evaluated 1 year ago.  At my request the patient underwent laboratory studies. These were performed on 05-18-2013. This shows a TSH level of 0.751. Vitamin D level is normal at 44.9. Calcium level is at the upper range of normal at 10.3. Intact PTH level is normal at 58.  Patient underwent thyroid ultrasound. This was performed 04/22/2013 and compared to his prior study of 04/07/2012. This shows a mildly enlarged thyroid gland with bilateral thyroid nodules. The largest nodule in the left has increased in size and currently measures 1.7 x 1.3 x 1.7 cm. Percutaneous biopsy was recommended by radiology.  PERTINENT REVIEW OF SYSTEMS: Denies tremor. Denies palpitations. Denies compressive symptoms. No new masses. No tenderness.  EXAM: HEENT: normocephalic; pupils equal and reactive; sclerae clear; dentition good; mucous membranes moist NECK:  Left thyroid lobe with palpable inferior nodule, approximately 2 cm in size, smooth, mobile with swallowing; symmetric on extension; no palpable anterior or posterior cervical lymphadenopathy; no supraclavicular masses; no tenderness CHEST: clear to auscultation bilaterally without rales, rhonchi, or wheezes CARDIAC: regular rate and rhythm without significant murmur; peripheral pulses are full EXT:  non-tender without edema; no deformity NEURO: no gross focal deficits; no sign of tremor   IMPRESSION: #1 bilateral thyroid nodules, interval enlargement left inferior nodule, biopsy recommended #2 borderline hypercalcemia, normal intact PTH level  PLAN: The patient and I discussed the above findings. We reviewed his ultrasound report. I have recommended that he proceed with biopsy  of the inferior left thyroid nodule which has increased in size over the past year. We will make arrangements for this study. I will contact him with the results.  The patient's calcium level is just above the normal range. At his age, I would not pursue a diagnosis of primary hyperparathyroidism and less the calcium level was greater than 11.0. This is in agreement with the consensus statement.  Provided the fine-needle aspiration biopsy results are benign, I will plan to see the patient back in one year and we will repeat his TSH level, PTH level, and calcium level at that time. We will also obtain a thyroid ultrasound for comparison purposes. Patient understands and agrees with this plan.  Earnstine Regal, MD, [facility] Office: [phone]  Visit Diagnoses: 1. Multiple thyroid nodules   2. Hyperparathyroidism, primary

## 2013-05-13 NOTE — Patient Instructions (Signed)
Thyroid Biopsy The thyroid gland is a butterfly-shaped gland situated in the front of the neck. It produces hormones which affect metabolism, growth and development, and body temperature. A thyroid biopsy is a procedure in which small samples of tissue or fluid are removed from the thyroid gland or mass and examined under a microscope. This test is done to determine the cause of thyroid problems, such as infection, cancer, or other thyroid problems. There are 2 ways to obtain samples: 1. Fine needle biopsy. Samples are removed using a thin needle inserted through the skin and into the thyroid gland or mass. 2. Open biopsy. Samples are removed after a cut (incision) is made through the skin. LET YOUR CAREGIVER KNOW ABOUT:   Allergies.  Medications taken including herbs, eye drops, over-the-counter medications, and creams.  Use of steroids (by mouth or creams).  Previous problems with anesthetics or numbing medicine.  Possibility of pregnancy, if this applies.  History of blood clots (thrombophlebitis).  History of bleeding or blood problems.  Previous surgery.  Other health problems. RISKS AND COMPLICATIONS  Bleeding from the site. The risk of bleeding is higher if you have a bleeding disorder or are taking any blood thinning medications (anticoagulants).  Infection.  Injury to structures near the thyroid gland. BEFORE THE PROCEDURE  This is a procedure that can be done as an outpatient. Confirm the time that you need to arrive for your procedure. Confirm whether there is a need to fast or withhold any medications. A blood sample may be done to determine your blood clotting time. Medicine may be given to help you relax (sedative). PROCEDURE Fine needle biopsy. You will be awake during the procedure. You may be asked to lie on your back with your head tipped backward to extend your neck. Let your caregiver know if you cannot tolerate the positioning. An area on your neck will be  cleansed. A needle is inserted through the skin of your neck. You may feel a mild discomfort during this procedure. You may be asked to avoid coughing, talking, swallowing, or making sounds during some portions of the procedure. The needle is withdrawn once tissue or fluid samples have been removed. Pressure may be applied to the neck to reduce swelling and ensure that bleeding has stopped. The samples will be sent for examination.  Open biopsy. You will be given general anesthesia. You will be asleep during the procedure. An incision is made in your neck. A sample of thyroid tissue or the mass is removed. The tissue sample or mass will be sent for examination. The sample or mass may be examined during the biopsy. If the sample or mass contains cancer cells, some or all of the thyroid gland may be removed. The incision is closed with stitches. AFTER THE PROCEDURE  Your recovery will be assessed and monitored. If there are no problems, as an outpatient, you should be able to go home shortly after the procedure. If you had a fine needle biopsy:  You may have soreness at the biopsy site for 1 to 2 days. If you had an open biopsy:   You may have soreness at the biopsy site for 3 to 4 days.  You may have a hoarse voice or sore throat for 1 to 2 days. Obtaining the Test Results It is your responsibility to obtain your test results. Do not assume everything is normal if you have not heard from your caregiver or the medical facility. It is important for you to follow up  on all of your test results. HOME CARE INSTRUCTIONS   Keeping your head raised on a pillow when you are lying down may ease biopsy site discomfort.  Supporting the back of your head and neck with both hands as you sit up from a lying position may ease biopsy site discomfort.  Only take over-the-counter or prescription medicines for pain, discomfort, or fever as directed by your caregiver.  Throat lozenges or gargling with warm salt  water may help to soothe a sore throat. SEEK IMMEDIATE MEDICAL CARE IF:   You have severe bleeding from the biopsy site.  You have difficulty swallowing.  You have a fever.  You have increased pain, swelling, redness, or warmth at the biopsy site.  You notice pus coming from the biopsy site.  You have swollen glands (lymph nodes) in your neck. Document Released: 11/04/2006 Document Revised: 05/04/2012 Document Reviewed: 04/06/2008 [facility] Patient Information 2014 [place], [place].

## 2013-05-25 ENCOUNTER — Other Ambulatory Visit (HOSPITAL_COMMUNITY)
Admission: RE | Admit: 2013-05-25 | Discharge: 2013-05-25 | Disposition: A | Discharge status: Home / Self Care | Payer: Medicare Other | Source: Ambulatory Visit | Attending: Diagnostic Radiology | Admitting: Diagnostic Radiology

## 2013-05-25 ENCOUNTER — Ambulatory Visit
Admission: RE | Admit: 2013-05-25 | Discharge: 2013-05-25 | Disposition: A | Discharge status: Home / Self Care | Payer: Medicare Other | Source: Ambulatory Visit | Attending: Surgery | Admitting: Surgery

## 2013-05-25 DIAGNOSIS — E041 Nontoxic single thyroid nodule: Secondary | ICD-10-CM

## 2013-05-25 DIAGNOSIS — E049 Nontoxic goiter, unspecified: Secondary | ICD-10-CM | POA: Insufficient documentation

## 2013-06-07 ENCOUNTER — Encounter (INDEPENDENT_AMBULATORY_CARE_PROVIDER_SITE_OTHER): Payer: Self-pay

## 2013-06-22 ENCOUNTER — Telehealth (INDEPENDENT_AMBULATORY_CARE_PROVIDER_SITE_OTHER): Payer: Self-pay

## 2013-06-22 ENCOUNTER — Other Ambulatory Visit (INDEPENDENT_AMBULATORY_CARE_PROVIDER_SITE_OTHER): Payer: Self-pay

## 2013-06-22 DIAGNOSIS — E042 Nontoxic multinodular goiter: Secondary | ICD-10-CM

## 2013-06-22 NOTE — Progress Notes (Signed)
Quick Note:  Please contact patient and notify of benign pathology results.  Taos Tapp M. Milica Gully, MD, FACS [facility] Office: [phone]   ______ 

## 2013-06-22 NOTE — Telephone Encounter (Signed)
Message copied by Dois Davenport on Tue Jun 22, 2013  1:55 PM ------      Message from: Earnstine Regal      Created: Tue Jun 22, 2013  1:32 PM       Please contact patient and notify of benign pathology results.            Earnstine Regal, MD, [facility]      Office: [phone]             ------

## 2013-06-22 NOTE — Telephone Encounter (Signed)
LMOM for pt to call. Pt can be notified of attached path result.

## 2013-06-22 NOTE — Telephone Encounter (Signed)
Message copied by Dois Davenport on Tue Jun 22, 2013  1:56 PM ------      Message from: Earnstine Regal      Created: Tue Jun 22, 2013  1:31 PM       Renee Harder on FNA biopsy is benign.  Please let patient know.  I would like to see him back in one year with TSH level and PTH level at that time, and a thyroid ultrasound.            Thanks,            tmg ------

## 2013-06-22 NOTE — Telephone Encounter (Signed)
LMOM for pt to call back. Pt can be advised of attached msg from Dr Harlow Asa. I have placed pt on recall for one year f/u with labs and ultrasound prior to ov.

## 2013-08-11 ENCOUNTER — Ambulatory Visit (INDEPENDENT_AMBULATORY_CARE_PROVIDER_SITE_OTHER): Payer: Medicare Other | Admitting: Neurology

## 2013-08-11 ENCOUNTER — Encounter: Payer: Self-pay | Admitting: Neurology

## 2013-08-11 ENCOUNTER — Ambulatory Visit (INDEPENDENT_AMBULATORY_CARE_PROVIDER_SITE_OTHER): Payer: Medicare Other

## 2013-08-11 ENCOUNTER — Encounter (INDEPENDENT_AMBULATORY_CARE_PROVIDER_SITE_OTHER): Payer: Self-pay | Admitting: Neurology

## 2013-08-11 VITALS — BP 150/65 | HR 61 | Ht 68.5 in | Wt 243.0 lb

## 2013-08-11 DIAGNOSIS — G544 Lumbosacral root disorders, not elsewhere classified: Secondary | ICD-10-CM

## 2013-08-11 DIAGNOSIS — G5601 Carpal tunnel syndrome, right upper limb: Secondary | ICD-10-CM

## 2013-08-11 DIAGNOSIS — Z0289 Encounter for other administrative examinations: Secondary | ICD-10-CM

## 2013-08-11 DIAGNOSIS — G609 Hereditary and idiopathic neuropathy, unspecified: Secondary | ICD-10-CM

## 2013-08-11 NOTE — Procedures (Signed)
HISTORY:  Alex Cherry is a 80 year old gentleman with a history of obesity and a history of some low back pain. Reports a long-standing history of numbness in the feet, and pain in the back radiating down both legs, right greater than left. He has more recently developed some edema below the knees bilaterally, right greater than left. He is being evaluated for possible neuropathy or a lumbosacral radiculopathy.  NERVE CONDUCTION STUDIES:  Nerve conduction studies were performed on the right upper extremity. The distal motor latency for the right median nerve was prolonged, with a borderline normal motor amplitude for this nerve. The distal motor latency and motor amplitude for the right ulnar nerve was normal. The F wave latencies and nerve conduction velocities for the median and ulnar nerves were normal. The sensory latency for the right median nerve was prolonged, normal for the right ulnar nerve.  Nerve conduction studies were performed on both lower extremities. The distal motor latencies for the peroneal and posterior tibial nerves were normal bilaterally, with low motor amplitudes for these nerves bilaterally. The nerve conduction velocities for the peroneal and posterior tibial nerves were normal bilaterally. The sensory latencies for the peroneal nerves were absent bilaterally, and the H reflex latencies were absent bilaterally.  EMG STUDIES:  EMG study was performed on the right lower extremity:  The tibialis anterior muscle reveals 2 to 5K motor units with decreased recruitment. No fibrillations or positive waves were seen. The peroneus tertius muscle reveals 2 to 5K motor units with decreased recruitment. One plus fibrillations and positive waves were seen. The medial gastrocnemius muscle reveals 2 to 4K motor units with decreased recruitment. 2+ fibrillations and positive waves were seen. The vastus lateralis muscle reveals 2 to 4K motor units with full recruitment. No  fibrillations or positive waves were seen. The iliopsoas muscle reveals 2 to 4K motor units with full recruitment. No fibrillations or positive waves were seen. The biceps femoris muscle (long head) reveals 2 to 4K motor units with full recruitment. No fibrillations or positive waves were seen. The lumbosacral paraspinal muscles were tested at 3 levels, and revealed 1+ fibrillations and positive waves at all 3 levels tested. There was good relaxation.  EMG study was performed on the left lower extremity:  The tibialis anterior muscle reveals 2 to 6K motor units with moderately decreased recruitment. No fibrillations or positive waves were seen. The peroneus tertius muscle reveals 2 to 8K motor units with moderately decreased recruitment. No fibrillations or positive waves were seen. The medial gastrocnemius muscle reveals up to 6K motor units with decreased recruitment. No fibrillations or positive waves were seen. The vastus lateralis muscle reveals 2 to 4K motor units with full recruitment. No fibrillations or positive waves were seen. The iliopsoas muscle reveals 2 to 4K motor units with full recruitment. No fibrillations or positive waves were seen. The biceps femoris muscle (long head) reveals 2 to 4K motor units with full recruitment. No fibrillations or positive waves were seen. The lumbosacral paraspinal muscles were tested at 3 levels, and revealed no abnormalities of insertional activity at the upper level tested. 2+ fibrillations and positive waves were seen at the middle and lower levels. There was good relaxation.   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 The degree of lowering of motor amplitudes on nerve conduction studies in the legs may in part be related to  the peripheral edema. There is also evidence of an overlying mild right  carpal tunnel syndrome. EMG evaluation of the right lower extremity shows findings most consistent with an acute and chronic S1 radiculopathy, with possibly some involvement at L5 level as well. EMG of the left lower extremity shows chronic more severe changes within the S1 nerve root distribution, with some possible mild involvement of the L5 level as well.  Jill Alexanders MD 08/11/2013 12:56 PM  [facility] [address] [place], [place] [address]  Phone [phone] Fax [phone]

## 2013-08-11 NOTE — Progress Notes (Signed)
[facility]    Provider:  Dr Janann Colonel Referring Provider: Leonard Downing, * Primary Care Physician:  Leonard Downing, MD  CC:  Swelling and paresthesias in his feet  HPI:  Alex Cherry is a 80 y.o. male here as a referral from Dr. Arelia Sneddon for lower extremity edema and night time burning and paresthesias. Symptoms began around 1 year ago. Notes that the bottom of his feet have no sensation. Paresthesias are worse at night or when he is relaxing. Feet get very hot and are uncomfortable at night when they are under the covers. Has a hard time when driving, has difficulty sensing if he is on the brake or gas pedal. Will drag his feet a little bit, states he has trouble telling where his feet are in space. No difficulty with temperature in the shower/tub. Has a history of sciatic pain and bilateral hip pain.   States he does not have diabetes.   Review of Systems: Out of a complete 14 system review, the patient complains of only the following symptoms, and all other reviewed systems are negative. +paresthesias, fatigue, weakness  History   Social History  . Marital Status: Widowed    Spouse Name: N/A    Number of Children: 2  . Years of Education: 12   Occupational History  . Retired     Social History Main Topics  . Smoking status: Former Smoker -- 50 years    Types: Cigarettes    Quit date: 12/03/1993  . Smokeless tobacco: Never Used  . Alcohol Use: Yes  . Drug Use: No  . Sexual Activity: Not on file   Other Topics Concern  . Not on file   Social History Narrative   Patient lives at home alone.    Patient is widowed.    Patient has 2 children.    Patient has a high school education.    Patient is right handed.    Patient is retired.     Family History  Problem Relation Age of Onset  . Cancer Sister     Past Medical History  Diagnosis Date  . Asthma     as a child  . Hypertension     Denies any other heart  conditions, no chest  pain or arrtthy.  . Arthritis   . Hearing loss   . Nasal congestion   . Trouble swallowing   . Cough   . Wheezing   . Constipation   . Difficulty urinating   . Blood in urine   . Easy bruising   . Hyperparathyroidism     Past Surgical History  Procedure Laterality Date  . Lithotripsy  2010, 2012     two procedures performed  . Cystoscopy w/ ureteral stent placement  2009    with stone removal    Current Outpatient Prescriptions  Medication Sig Dispense Refill  . acetaminophen (TYLENOL) 500 MG tablet Take 500 mg by mouth every 6 (six) hours as needed. PAIN        . diltiazem (CARDIZEM CD) 180 MG 24 hr capsule Take 180 mg by mouth every morning.        . fish oil-omega-3 fatty acids 1000 MG capsule Take 1 g by mouth daily.        . naproxen (NAPROSYN) 500 MG tablet Take 500 mg by mouth 2 (two) times daily with a meal.      . sildenafil (REVATIO) 20 MG tablet Take 20 mg by mouth 3 (three) times daily.      Marland Kitchen  sodium chloride (OCEAN) 0.65 % nasal spray Place 1 spray into the nose as needed. ALLERGIES         No current facility-administered medications for this visit.    Allergies as of 08/11/2013 - Review Complete 08/11/2013  Allergen Reaction Noted  . Diphenhydramine Hives, Itching, and Nausea Only 11/26/2010  . Penicillins Hives, Itching, and Nausea Only 11/26/2010  . Septra [sulfamethoxazole w/trimethoprim (co-trimoxazole)] Hives, Itching, and Nausea Only 11/26/2010    Vitals: BP 150/65  Pulse 61  Ht 5' 8.5" (1.74 m)  Wt 243 lb (110.224 kg)  BMI 36.41 kg/m2 Last Weight:  Wt Readings from Last 1 Encounters:  08/11/13 243 lb (110.224 kg)   Last Height:   Ht Readings from Last 1 Encounters:  08/11/13 5' 8.5" (1.74 m)     Physical exam: Exam: Gen: NAD, conversant Eyes: anicteric sclerae, moist conjunctivae HENT: Atraumatic, oropharynx clear Neck: Trachea midline; supple,  Lungs: CTA, no wheezing, rales, rhonic                          CV: RRR, no  MRG Abdomen: Soft, non-tender;  Extremities: 2+ pitting edema bilat LE Skin: Normal temperature, no rash,  Psych: Appropriate affect, pleasant  Neuro: MS: AA&Ox3, appropriately interactive, normal affect   Attention: WORLD backwards  Speech: fluent w/o paraphasic error  Memory: good recent and remote recall  CN: PERRL, EOMI no nystagmus, no ptosis, sensation intact to LT V1-V3 bilat, face symmetric, no weakness, hearing grossly intact, palate elevates symmetrically, shoulder shrug 5/5 bilat,  tongue protrudes midline, no fasiculations noted.  Motor: normal bulk and tone Strength: 5/5  In all extremities  Coord: rapid alternating and point-to-point (FNF, HTS) movements intact.  Reflexes: decreased patellar, absent AJ bilat, bilat downgoing toes  Sens: decreased LT, PP, temp, vibration and proprioception bilat LE  Gait: posture, stance, stride and arm-swing normal. Unable to tandem, wobbles with Romberg but doesn't fall   Assessment:  After physical and neurologic examination, review of laboratory studies, imaging, neurophysiology testing and pre-existing records, assessment will be reviewed on the problem list.  Plan:  Treatment plan and additional workup will be reviewed under Problem List.  1)Peripheral neuropathy 2)Sciatica  80y/o gentleman presenting for initial evaluation of bilateral paresthesias. Based on history and physical exam this is most consistent with a diagnosis of peripheral neuropathy. Will check B12, TSH, MMA, HbA1c and EMG/NCS. Follow up once workup completed.  Jim Like, DO  [facility] [address] [place], [place] [address]  Phone [phone] Fax [phone]

## 2013-08-13 ENCOUNTER — Telehealth: Payer: Self-pay | Admitting: *Deleted

## 2013-08-13 LAB — VITAMIN B12: Vitamin B-12: 424 pg/mL (ref 211–946)

## 2013-08-13 LAB — PROTEIN ELECTROPHORESIS
A/G Ratio: 1.3 (ref 0.7–2.0)
ALPHA 1: 0.2 g/dL (ref 0.1–0.4)
ALPHA 2: 0.6 g/dL (ref 0.4–1.2)
Albumin ELP: 3.3 g/dL (ref 3.2–5.6)
Beta: 0.9 g/dL (ref 0.6–1.3)
GAMMA GLOBULIN: 0.9 g/dL (ref 0.5–1.6)
Globulin, Total: 2.6 g/dL (ref 2.0–4.5)
Total Protein: 5.9 g/dL — ABNORMAL LOW (ref 6.0–8.5)

## 2013-08-13 LAB — HGB A1C W/O EAG: HEMOGLOBIN A1C: 6.1 % — AB (ref 4.8–5.6)

## 2013-08-13 LAB — METHYLMALONIC ACID, SERUM: METHYLMALONIC ACID: 154 nmol/L (ref 0–378)

## 2013-08-13 LAB — TSH: TSH: 0.964 u[IU]/mL (ref 0.450–4.500)

## 2013-08-13 NOTE — Telephone Encounter (Signed)
Patient is aware of unremarkable lab results.

## 2013-08-13 NOTE — Telephone Encounter (Signed)
Message copied by Vivi Barrack on Fri Aug 13, 2013  3:35 PM ------      Message from: Drema Dallas      Created: Fri Aug 13, 2013  8:10 AM       Please let him know his labs are overall unremarkable. We are awaiting his EMG/NCS. Thanks. ------

## 2013-08-17 ENCOUNTER — Encounter: Payer: PRIVATE HEALTH INSURANCE | Admitting: Neurology

## 2013-12-08 ENCOUNTER — Encounter: Payer: Self-pay | Admitting: Neurology

## 2013-12-14 ENCOUNTER — Encounter: Payer: Self-pay | Admitting: Neurology

## 2014-05-12 ENCOUNTER — Other Ambulatory Visit: Payer: Self-pay | Admitting: Urology

## 2014-05-17 ENCOUNTER — Encounter (HOSPITAL_COMMUNITY): Payer: Self-pay | Admitting: *Deleted

## 2014-05-23 ENCOUNTER — Ambulatory Visit (HOSPITAL_COMMUNITY): Payer: Medicare Other

## 2014-05-23 ENCOUNTER — Ambulatory Visit (HOSPITAL_COMMUNITY)
Admission: RE | Admit: 2014-05-23 | Discharge: 2014-05-23 | Disposition: A | Discharge status: Home / Self Care | Payer: Medicare Other | Source: Ambulatory Visit | Attending: Urology | Admitting: Urology

## 2014-05-23 ENCOUNTER — Encounter (HOSPITAL_COMMUNITY): Payer: Self-pay | Admitting: *Deleted

## 2014-05-23 ENCOUNTER — Encounter (HOSPITAL_COMMUNITY)
Admission: RE | Disposition: A | Discharge status: Home / Self Care | Payer: Self-pay | Source: Ambulatory Visit | Attending: Urology

## 2014-05-23 DIAGNOSIS — Z87891 Personal history of nicotine dependence: Secondary | ICD-10-CM | POA: Diagnosis not present

## 2014-05-23 DIAGNOSIS — N201 Calculus of ureter: Secondary | ICD-10-CM | POA: Insufficient documentation

## 2014-05-23 DIAGNOSIS — K219 Gastro-esophageal reflux disease without esophagitis: Secondary | ICD-10-CM | POA: Insufficient documentation

## 2014-05-23 DIAGNOSIS — Z87440 Personal history of urinary (tract) infections: Secondary | ICD-10-CM | POA: Diagnosis not present

## 2014-05-23 DIAGNOSIS — Z882 Allergy status to sulfonamides status: Secondary | ICD-10-CM | POA: Insufficient documentation

## 2014-05-23 DIAGNOSIS — F419 Anxiety disorder, unspecified: Secondary | ICD-10-CM | POA: Diagnosis not present

## 2014-05-23 DIAGNOSIS — M1990 Unspecified osteoarthritis, unspecified site: Secondary | ICD-10-CM | POA: Diagnosis not present

## 2014-05-23 DIAGNOSIS — Z880 Allergy status to penicillin: Secondary | ICD-10-CM | POA: Diagnosis not present

## 2014-05-23 DIAGNOSIS — E213 Hyperparathyroidism, unspecified: Secondary | ICD-10-CM | POA: Insufficient documentation

## 2014-05-23 DIAGNOSIS — Z881 Allergy status to other antibiotic agents status: Secondary | ICD-10-CM | POA: Insufficient documentation

## 2014-05-23 DIAGNOSIS — J45909 Unspecified asthma, uncomplicated: Secondary | ICD-10-CM | POA: Diagnosis not present

## 2014-05-23 DIAGNOSIS — N3941 Urge incontinence: Secondary | ICD-10-CM | POA: Insufficient documentation

## 2014-05-23 HISTORY — DX: Chronic kidney disease, unspecified: N18.9

## 2014-05-23 SURGERY — LITHOTRIPSY, ESWL
Anesthesia: LOCAL | Laterality: Right

## 2014-05-23 MED ORDER — TAMSULOSIN HCL 0.4 MG PO CAPS
0.4000 mg | ORAL_CAPSULE | ORAL | Status: DC
Start: 2014-05-23 — End: 2017-08-15

## 2014-05-23 MED ORDER — DIAZEPAM 5 MG PO TABS
10.0000 mg | ORAL_TABLET | ORAL | Status: AC
  Administered 2014-05-23: 10 mg via ORAL
  Filled 2014-05-23: qty 2

## 2014-05-23 MED ORDER — SODIUM CHLORIDE 0.9 % IV SOLN
INTRAVENOUS | Status: DC
  Administered 2014-05-23: 08:00:00 via INTRAVENOUS

## 2014-05-23 MED ORDER — CIPROFLOXACIN HCL 500 MG PO TABS
500.0000 mg | ORAL_TABLET | ORAL | Status: AC
  Administered 2014-05-23: 500 mg via ORAL
  Filled 2014-05-23: qty 1

## 2014-05-23 MED ORDER — OXYCODONE HCL 10 MG PO TABS: 10.0000 mg | ORAL_TABLET | ORAL | Status: DC | PRN

## 2014-05-23 NOTE — Discharge Instructions (Signed)

## 2014-05-23 NOTE — Op Note (Signed)
See [facility] OP note scanned into chart. Also because of the size, density, location and other factors that cannot be anticipated I feel this will likely be a staged procedure. This fact supersedes any indication in the scanned [facility] operative note to the contrary.  

## 2014-05-23 NOTE — H&P (Signed)
History of Present Illness Alex Cherry is a 81 YO male patient with a right ureteral stone.    GU hx:  Complaint of some right flank pressure for the last month. It is constant but mild. He has had no nausea. It is worse when he leans over on it. He has had no hematuria. He has known bilateral renal stones. He also has a history of an elevated PSA and his PSA is back up to 8.43 from 6.99 but it was 8.48 in11/14. 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  He has had chronic right groin pain but that has resolved. He has a history of BPH with BOO and has occasional difficulty starting his stream and stable urgency. He has ED and is having results with generic sildenafil. He reports reduce ejaculate volume.       He has a history of hyperparathyroidism, but wasn't felt to have a sufficient problem to require surgery and was managed with Vit D by Dr. Harlow Asa with improvement.    Interval Hx:  Today denies passing any stone material. Has mild intermittent back pain which Tylenol controls. Denies hematuria. He stopped Rapaflo due to severe frequency.  Past Medical History Problems  1. History of Abdominal pain, RLQ (right lower quadrant) (R10.31) 2. History of Anxiety (F41.9) 3. History of Arthritis 4. History of Asthma (J45.909) 5. History of Calculus of ureter (N20.1) 6. History of Calculus of ureter (N20.1) 7. History of esophageal reflux (Z87.19) 8. History of hypercholesterolemia (Z86.39) 9. History of hypertension (Z86.79) 10. History of kidney stones (Z87.442) 11. History of sciatica (Z86.69) 12. History of urinary tract infection (Z87.440) 13. History of Microscopic hematuria (R31.2) 14. History of Urge incontinence of urine (N39.41)  Surgical History Problems  1. History of Cystoscopy With Ureteroscopy Right 2. History of Hand Surgery 3. History of Lithotripsy 4. History of Lithotripsy 5. History of Pilonidal Cyst Resection  Current Meds 1. Albuterol AERS;  Therapy: (Recorded:04Mar2015) to Recorded 2. Diltiazem HCl TABS;  Therapy: (Recorded:27Jun2008) to Recorded 3. Sildenafil Citrate 20 MG Oral Tablet; take 2-5 tabs as needed;  Therapy: [id number] to (Last Rx:04Mar2015) Ordered 4. Tylenol CAPS;  Therapy: (TIRWERXV:[id number]) to Recorded 5. Vitamin B-12 TABS;  Therapy: (Recorded:09Mar2016) to Recorded  Allergies Medication  1. Septra SOLN 2. Benadryl CAPS 3. Penicillins  Family History Problems  1. Family history of Acute Myocardial Infarction : Father 2. Family history of Alcoholism : Father 3. Family history of Death In The Family Father : Father   MIAge of death was 52 4. Family history of Death In The Family Mother : Mother   Con Memos of death was 36 5. Family history of Family Health Status Number Of Children   One son and one daughter  Social History Problems  1. Alcohol Use   Socially 2. Denied: History of Caffeine Use 3. Former smoker [phone]) 4. Retired From Work 5. History of Tobacco Use 6. Widowed    Review of Systems Genitourinary, constitutional, skin, eye, otolaryngeal, hematologic/lymphatic, cardiovascular, pulmonary, endocrine, musculoskeletal, gastrointestinal, neurological and psychiatric system(s) were reviewed and pertinent findings if present are noted and are otherwise negative.   Vitals Vital Signs   Blood Pressure: 130 / 80 Temperature: 97.5 F Heart Rate: 56   Physical Exam Constitutional: Well nourished and well developed . No acute distress.  ENT:. The ears and nose are normal in appearance.  Neck: The appearance of the neck is normal and no neck mass is present.  Pulmonary: No respiratory distress and  normal respiratory rhythm and effort.  Cardiovascular: Heart rate and rhythm are normal . No peripheral edema.  Abdomen: The abdomen is soft and nontender. No masses are palpated. No CVA tenderness. No hernias are palpable. No hepatosplenomegaly noted.  Rectal: Rectal exam  demonstrates normal sphincter tone, no tenderness and no masses. The prostate has no nodularity and is not tender. The left seminal vesicle is nonpalpable. The right seminal vesicle is nonpalpable. The perineum is normal on inspection.  Genitourinary: Examination of the penis demonstrates no discharge, no masses, no lesions and a normal meatus. The scrotum is without lesions. The right epididymis is palpably normal and non-tender. The left epididymis is palpably normal and non-tender. The right testis is non-tender and without masses. The left testis is non-tender and without masses.  Lymphatics: The femoral and inguinal nodes are not enlarged or tender.  Skin: Normal skin turgor, no visible rash and no visible skin lesions.  Neuro/Psych:. Mood and affect are appropriate.      KUB: shows stable bilateral renal calculi. There is significant progression of right ureteral stone. It is now progressed to mid/distal ureter at L4-5. RUS: right kidney: 11.67 X 0.96 X 4.96 X 5.45 cm. ? area vs normal tissue mid pole 4.16 X 2.63 X 3.17 cm. Left kidney: 11.44 X 0.86 X 5.19 X 4.65 cm. Bilateral multiple stones. No bilateral hydronephrosis noted. Liver cyst: 1.47 X 1.04 X 1.59 cm.  The following clinical lab reports were reviewed:  UA: negative pH 5.5 chronic stable intermittent microscopic hematuria.  PVR: Ultrasound PVR 54.15 ml.   Impression: Right ureteral stone. We discussed the management of urinary stones. These options include observation, ureteroscopy, shockwave lithotripsy, and PCNL. We discussed which options are relevant to these particular stones. We discussed the natural history of stones as well as the complications of untreated stones and the impact on quality of life without treatment as well as with each of the above listed treatments. We also discussed the efficacy of each treatment in its ability to clear the stone burden. With any of these management options I discussed the signs and symptoms of  infection and the need for emergent treatment should these be experienced. For each option we discussed the ability of each procedure to clear the patient of their stone burden.  For observation I described the risks which include but are not limited to silent renal damage, life-threatening infection, need for emergent surgery, failure to pass stone, and pain.  For ureteroscopy I described the risks which include heart attack, stroke, pulmonary embolus, death, bleeding, infection, damage to contiguous structures, positioning injury, ureteral stricture, ureteral avulsion, ureteral injury, need for ureteral stent, inability to perform ureteroscopy, need for an interval procedure, inability to clear stone burden, stent discomfort and pain.  For shockwave lithotripsy I described the risks which include arrhythmia, kidney contusion, kidney hemorrhage, need for transfusion, long-term risk of diabetes or hypertension, back discomfort, flank ecchymosis, flank abrasion, inability to break up stone, inability to pass stone fragments, Steinstrasse, infection associated with obstructing stones, need for different surgical procedure and need for repeat shockwave lithotripsy.  Plan: He has elected to proceed with lithotripsy of his right ureteral stone.

## 2015-01-24 ENCOUNTER — Other Ambulatory Visit: Payer: Self-pay | Admitting: Urology

## 2015-01-25 ENCOUNTER — Encounter (HOSPITAL_COMMUNITY): Payer: Self-pay | Admitting: *Deleted

## 2015-01-30 ENCOUNTER — Ambulatory Visit (HOSPITAL_COMMUNITY): Payer: Medicare Other

## 2015-01-30 ENCOUNTER — Encounter (HOSPITAL_COMMUNITY)
Admission: RE | Disposition: A | Discharge status: Home / Self Care | Payer: Self-pay | Source: Ambulatory Visit | Attending: Urology

## 2015-01-30 ENCOUNTER — Ambulatory Visit (HOSPITAL_COMMUNITY)
Admission: RE | Admit: 2015-01-30 | Discharge: 2015-01-30 | Disposition: A | Discharge status: Home / Self Care | Payer: Medicare Other | Source: Ambulatory Visit | Attending: Urology | Admitting: Urology

## 2015-01-30 ENCOUNTER — Encounter (HOSPITAL_COMMUNITY): Payer: Self-pay | Admitting: General Practice

## 2015-01-30 DIAGNOSIS — E213 Hyperparathyroidism, unspecified: Secondary | ICD-10-CM | POA: Insufficient documentation

## 2015-01-30 DIAGNOSIS — Z79899 Other long term (current) drug therapy: Secondary | ICD-10-CM | POA: Insufficient documentation

## 2015-01-30 DIAGNOSIS — Z79891 Long term (current) use of opiate analgesic: Secondary | ICD-10-CM | POA: Diagnosis not present

## 2015-01-30 DIAGNOSIS — H9190 Unspecified hearing loss, unspecified ear: Secondary | ICD-10-CM | POA: Diagnosis not present

## 2015-01-30 DIAGNOSIS — I129 Hypertensive chronic kidney disease with stage 1 through stage 4 chronic kidney disease, or unspecified chronic kidney disease: Secondary | ICD-10-CM | POA: Diagnosis not present

## 2015-01-30 DIAGNOSIS — M1990 Unspecified osteoarthritis, unspecified site: Secondary | ICD-10-CM | POA: Insufficient documentation

## 2015-01-30 DIAGNOSIS — N201 Calculus of ureter: Secondary | ICD-10-CM

## 2015-01-30 DIAGNOSIS — Z87891 Personal history of nicotine dependence: Secondary | ICD-10-CM | POA: Diagnosis not present

## 2015-01-30 DIAGNOSIS — N200 Calculus of kidney: Secondary | ICD-10-CM | POA: Diagnosis present

## 2015-01-30 DIAGNOSIS — G4730 Sleep apnea, unspecified: Secondary | ICD-10-CM | POA: Insufficient documentation

## 2015-01-30 DIAGNOSIS — J45909 Unspecified asthma, uncomplicated: Secondary | ICD-10-CM | POA: Diagnosis not present

## 2015-01-30 DIAGNOSIS — E669 Obesity, unspecified: Secondary | ICD-10-CM | POA: Diagnosis not present

## 2015-01-30 SURGERY — LITHOTRIPSY, ESWL
Anesthesia: LOCAL | Laterality: Right

## 2015-01-30 MED ORDER — SODIUM CHLORIDE 0.9 % IV SOLN
INTRAVENOUS | Status: DC
  Administered 2015-01-30: 07:00:00 via INTRAVENOUS

## 2015-01-30 MED ORDER — CIPROFLOXACIN HCL 500 MG PO TABS
500.0000 mg | ORAL_TABLET | ORAL | Status: AC
  Administered 2015-01-30: 500 mg via ORAL
  Filled 2015-01-30: qty 1

## 2015-01-30 MED ORDER — DIAZEPAM 5 MG PO TABS
10.0000 mg | ORAL_TABLET | ORAL | Status: AC
  Administered 2015-01-30: 10 mg via ORAL
  Filled 2015-01-30: qty 2

## 2015-01-30 NOTE — Discharge Instructions (Signed)
See [facility] discharge instructions in chart.  

## 2015-01-30 NOTE — H&P (Signed)
Urology History and Physical Exam  CC: Kidney stone  HPI: 82 year old male presents for ESL of an 8 mm right midureteral stone.   PMH: Past Medical History  Diagnosis Date  . Asthma     as a child  . Hypertension     Denies any other heart  conditions, no chest pain or arrtthy.  . Arthritis   . Hearing loss   . Nasal congestion   . Trouble swallowing   . Cough   . Wheezing   . Constipation   . Difficulty urinating   . Blood in urine   . Easy bruising   . Hyperparathyroidism   . Chronic kidney disease     rt ureteral calculus    PSH: Past Surgical History  Procedure Laterality Date  . Lithotripsy  2010, 2012     two procedures performed  . Cystoscopy w/ ureteral stent placement  2009    with stone removal  . Tumor on thumb  30 yr ago    Allergies: Allergies  Allergen Reactions  . Diphenhydramine Hives, Itching and Nausea Only    All over the body   . Penicillins Hives, Itching and Nausea Only    All over the body  . Septra [Sulfamethoxazole W/Trimethoprim (Co-Trimoxazole)] Hives, Itching and Nausea Only    All over the body    Medications: Prescriptions prior to admission  Medication Sig Dispense Refill Last Dose  . acetaminophen (TYLENOL) 500 MG tablet Take 500 mg by mouth every 6 (six) hours as needed. PAIN     01/29/2015 at 1700  . albuterol (PROVENTIL HFA;VENTOLIN HFA) 108 (90 Base) MCG/ACT inhaler Inhale into the lungs as needed for wheezing or shortness of breath.   Past Week at Unknown time  . diltiazem (CARDIZEM CD) 180 MG 24 hr capsule Take 180 mg by mouth every other day.    01/29/2015 at 0900  . Vitamin D, Ergocalciferol, (DRISDOL) 50000 units CAPS capsule Take 50,000 Units by mouth every 7 (seven) days.   01/23/2015 at 0900  . Oxycodone HCl 10 MG TABS Take 1 tablet (10 mg total) by mouth every 4 (four) hours as needed. 30 tablet 0 has not takent any  . tamsulosin (FLOMAX) 0.4 MG CAPS capsule Take 1 capsule (0.4 mg total) by mouth daily after supper.  30 capsule 0   . vitamin B-12 (CYANOCOBALAMIN) 1000 MCG tablet Take 1,000 mcg by mouth daily.   01/28/2015 at 0900     Social History: Social History   Social History  . Marital Status: Widowed    Spouse Name: N/A  . Number of Children: 2  . Years of Education: 12   Occupational History  . Retired     Social History Main Topics  . Smoking status: Former Smoker -- 50 years    Types: Cigarettes    Quit date: 12/03/1993  . Smokeless tobacco: Never Used  . Alcohol Use: Yes  . Drug Use: No  . Sexual Activity: Not on file   Other Topics Concern  . Not on file   Social History Narrative   Patient lives at home alone.    Patient is widowed.    Patient has 2 children.    Patient has a high school education.    Patient is right handed.    Patient is retired.     Family History: Family History  Problem Relation Age of Onset  . Cancer Sister    ROS: Genitourinary: urinary frequency and feelings of urinary urgency,  but no dysuria and no hematuria.  Gastrointestinal: abdominal pain.  Constitutional: no fever.                  Physical Exam: '@VITALS2'$ @ Constitutional: Well nourished and well developed . No acute distress.  Pulmonary: No respiratory distress and normal respiratory rhythm and effort.  Cardiovascular: Heart rate and rhythm are normal . No peripheral edema.  Abdomen: The abdomen is mildly obese. The abdomen is soft and nontender. No masses are palpated. No CVA tenderness. Bowel sounds are normal. No hernias are palpable. No hepatosplenomegaly noted.  Genitourinary: Examination of the penis demonstrates no discharge, no masses, no lesions and a normal meatus. The scrotum is without lesions. The right epididymis is palpably normal and non-tender. The left epididymis is palpably normal and non-tender. The right testis is non-tender and without masses. The left testis is non-tender and without masses.  Skin: Normal skin turgor, no visible rash and no visible skin  lesions.  Neuro/Psych:. Mood and affect are appropriate.   Studies:  No results for input(s): HGB, WBC, PLT in the last 72 hours.  No results for input(s): NA, K, CL, CO2, BUN, CREATININE, CALCIUM, GFRNONAA, GFRAA in the last 72 hours.  Invalid input(s): MAGNESIUM   No results for input(s): INR, APTT in the last 72 hours.  Invalid input(s): PT   Invalid input(s): ABG    Assessment:  8 mm right midureteral stone  Plan: Right ESL

## 2016-07-08 ENCOUNTER — Other Ambulatory Visit (HOSPITAL_COMMUNITY): Payer: Self-pay | Admitting: Surgery

## 2016-07-08 DIAGNOSIS — E213 Hyperparathyroidism, unspecified: Secondary | ICD-10-CM

## 2016-07-08 DIAGNOSIS — E042 Nontoxic multinodular goiter: Secondary | ICD-10-CM

## 2016-07-18 ENCOUNTER — Encounter (HOSPITAL_COMMUNITY)
Admission: RE | Admit: 2016-07-18 | Discharge: 2016-07-18 | Disposition: A | Discharge status: Home / Self Care | Payer: Medicare Other | Source: Ambulatory Visit | Attending: Surgery | Admitting: Surgery

## 2016-07-18 ENCOUNTER — Encounter (HOSPITAL_COMMUNITY): Payer: Medicare Other

## 2016-07-18 DIAGNOSIS — E042 Nontoxic multinodular goiter: Secondary | ICD-10-CM | POA: Diagnosis present

## 2016-07-18 DIAGNOSIS — E213 Hyperparathyroidism, unspecified: Secondary | ICD-10-CM | POA: Insufficient documentation

## 2016-07-18 MED ORDER — TECHNETIUM TC 99M SESTAMIBI GENERIC - CARDIOLITE
25.0000 | Freq: Once | INTRAVENOUS | Status: AC | PRN
  Administered 2016-07-18: 25 via INTRAVENOUS

## 2016-07-26 ENCOUNTER — Other Ambulatory Visit: Payer: Self-pay | Admitting: Surgery

## 2016-07-26 DIAGNOSIS — E042 Nontoxic multinodular goiter: Secondary | ICD-10-CM

## 2016-08-08 ENCOUNTER — Ambulatory Visit
Admission: RE | Admit: 2016-08-08 | Discharge: 2016-08-08 | Disposition: A | Discharge status: Home / Self Care | Payer: Medicare Other | Source: Ambulatory Visit | Attending: Surgery | Admitting: Surgery

## 2016-08-08 ENCOUNTER — Other Ambulatory Visit (HOSPITAL_COMMUNITY)
Admission: RE | Admit: 2016-08-08 | Discharge: 2016-08-08 | Disposition: A | Discharge status: Home / Self Care | Payer: Medicare Other | Source: Ambulatory Visit | Attending: Student | Admitting: Student

## 2016-08-08 DIAGNOSIS — E042 Nontoxic multinodular goiter: Secondary | ICD-10-CM | POA: Diagnosis present

## 2016-08-21 ENCOUNTER — Other Ambulatory Visit: Payer: Self-pay | Admitting: Surgery

## 2016-08-21 DIAGNOSIS — E210 Primary hyperparathyroidism: Secondary | ICD-10-CM

## 2016-08-27 ENCOUNTER — Ambulatory Visit
Admission: RE | Admit: 2016-08-27 | Discharge: 2016-08-27 | Disposition: A | Discharge status: Home / Self Care | Payer: Medicare Other | Source: Ambulatory Visit | Attending: Surgery | Admitting: Surgery

## 2016-08-27 ENCOUNTER — Other Ambulatory Visit: Payer: Self-pay | Admitting: Family

## 2016-08-27 DIAGNOSIS — E210 Primary hyperparathyroidism: Secondary | ICD-10-CM

## 2016-08-27 MED ORDER — IOPAMIDOL (ISOVUE-300) INJECTION 61%
75.0000 mL | Freq: Once | INTRAVENOUS | Status: AC | PRN
  Administered 2016-08-27: 75 mL via INTRAVENOUS

## 2017-08-14 ENCOUNTER — Other Ambulatory Visit: Payer: Self-pay | Admitting: Family Medicine

## 2017-08-14 ENCOUNTER — Ambulatory Visit
Admission: RE | Admit: 2017-08-14 | Discharge: 2017-08-14 | Disposition: A | Discharge status: Home / Self Care | Payer: Medicare Other | Source: Ambulatory Visit | Attending: Family Medicine | Admitting: Family Medicine

## 2017-08-14 DIAGNOSIS — R0602 Shortness of breath: Secondary | ICD-10-CM

## 2017-08-14 DIAGNOSIS — M545 Low back pain, unspecified: Secondary | ICD-10-CM

## 2017-08-14 MED ORDER — IOPAMIDOL (ISOVUE-300) INJECTION 61%
100.0000 mL | Freq: Once | INTRAVENOUS | Status: AC | PRN
  Administered 2017-08-14: 100 mL via INTRAVENOUS

## 2017-08-15 ENCOUNTER — Telehealth: Payer: Self-pay | Admitting: Pulmonary Disease

## 2017-08-15 ENCOUNTER — Encounter: Payer: Self-pay | Admitting: Pulmonary Disease

## 2017-08-15 ENCOUNTER — Ambulatory Visit
Admission: RE | Admit: 2017-08-15 | Discharge: 2017-08-15 | Disposition: A | Discharge status: Home / Self Care | Payer: Medicare Other | Source: Ambulatory Visit | Attending: Pulmonary Disease | Admitting: Pulmonary Disease

## 2017-08-15 ENCOUNTER — Ambulatory Visit (INDEPENDENT_AMBULATORY_CARE_PROVIDER_SITE_OTHER): Payer: Medicare Other | Admitting: Pulmonary Disease

## 2017-08-15 VITALS — BP 118/74 | HR 74 | Ht 67.0 in | Wt 224.0 lb

## 2017-08-15 DIAGNOSIS — N201 Calculus of ureter: Secondary | ICD-10-CM

## 2017-08-15 DIAGNOSIS — R0602 Shortness of breath: Secondary | ICD-10-CM

## 2017-08-15 DIAGNOSIS — J90 Pleural effusion, not elsewhere classified: Secondary | ICD-10-CM | POA: Diagnosis not present

## 2017-08-15 MED ORDER — IOPAMIDOL (ISOVUE-370) INJECTION 76%
75.0000 mL | Freq: Once | INTRAVENOUS | Status: AC | PRN
  Administered 2017-08-15: 75 mL via INTRAVENOUS

## 2017-08-15 NOTE — Patient Instructions (Signed)
Obtain blood work from Dr. Arelia Sneddon.  CT angiogram stat to rule out pulmonary embolism  Schedule echocardiogram

## 2017-08-15 NOTE — Assessment & Plan Note (Addendum)
Cause for unilateral right effusion is unclear, may be related to pleurisy.  He did not have preceding viral illness. He does have pedal edema but no overt signs of heart failure otherwise, no orthopnea, proximal nocturnal dyspnea or crackles.  He does not seem to have liver disease to explain. After recent car drive and sudden onset of symptoms, had to rule out pulmonary embolism.  We will obtain d-dimer and CT angiogram stat  If this is negative, then we can plan for echocardiogram next week and proceed with thoracentesis  Addendum - CT angio negative pulmonary embolism. Please arrange for echocardiogram and ultrasound-guided thoracentesis next week-send fluid for testing including cell count, chemistry, LDH/protein, glucose and cytology

## 2017-08-15 NOTE — Telephone Encounter (Signed)
Called and spoke to pt. Pt states he was seen by PCP, Dr Arelia Sneddon, and advised he would need to see a pulmonologist. Called Dr. Arelia Sneddon office and spoke with Janett Billow and was advised they have faxed over a referral for pt to be seen asap due to conflicting scans pna vs pleural effusion. Pt has been scheduled with Dr. Elsworth Soho today at 2:30p. Pt verbalized understanding and denied any further questions or concerns at this time.

## 2017-08-15 NOTE — Progress Notes (Signed)
Subjective:    Patient ID: Alex Cherry, male    DOB: 06-11-1933, 84 y.o.   MRN: [id number]  HPI  Chief Complaint  Patient presents with  . Pulm Consult    Referred by Dr. Arelia Sneddon for abnormal CT scans. Per patient, he was exposed to "liquid fire aka drain opener". Ever since then, he has been SOB. Throat has felt scratchy.  Also has middle back pain.     84 year old remote smoker presents with his daughter [name] for evaluation of dyspnea and back pain.  He reports exposure to "liquid fire" drain and clogging fluid that produced fumes about 3 weeks ago.  He has developed mild dyspnea and scratchy throat since then.  He developed right-sided loin pain around 7/16 His endocrinologist give him a short course of prednisone since he had taken a "nuclear pill" for thyroid ablation and developed throat irritation He has a history of recurrent renal calculi and his urologist called him on 7/18 and asked him to take doxycycline 100 twice daily for 60 days due to prostatitis, he passed a kidney stone on 7/20 .  He took a trip to [place] and on his way back developed worsening bilateral lower chest and loin pain which persisted.   Shortness of breath was worse on 7/22.  He saw his PCP Dr. Arelia Sneddon 7/25 chest x-ray suggested right lower lobe consolidation/effusion. CT  abdomen/pelvis with contrast was obtained which showed right pleural effusion which is new since 2014.  No consolidation was noted in the lung base.  There was chronic left renal staghorn calculus noted without obstructive uropathy.  There was progressive lumbar spine degeneration. He reports that albuterol MDI helps his breathing   He denies fevers, chills or sputum production. He smoked 2 packs/day before he quit in 1990, more than 50 pack years. Lives alone and is retired  He states that his right leg always swells less than his left leg, I note that venous Doppler 03/2013 was negative  Significant tests/ events  reviewed  Oxygen saturation dropped to 91% on 1 lap around the office, 185 feet  Past Medical History:  Diagnosis Date  . Arthritis   . Asthma    as a child  . Blood in urine   . Chronic kidney disease    rt ureteral calculus  . Constipation   . Cough   . Difficulty urinating   . Easy bruising   . Hearing loss   . Hyperparathyroidism   . Hypertension    Denies any other heart  conditions, no chest pain or arrtthy.  . Nasal congestion   . Trouble swallowing   . Wheezing    Past Surgical History:  Procedure Laterality Date  . CYSTOSCOPY W/ URETERAL STENT PLACEMENT  2009   with stone removal  . LITHOTRIPSY  2010, 2012    two procedures performed  . tumor on thumb  30 yr ago    Allergies  Allergen Reactions  . Diphenhydramine Hives, Itching and Nausea Only    All over the body   . Penicillins Hives, Itching and Nausea Only    All over the body  . Septra [Sulfamethoxazole W/Trimethoprim (Co-Trimoxazole)] Hives, Itching and Nausea Only    All over the body    Social History   Socioeconomic History  . Marital status: Widowed    Spouse name: Not on file  . Number of children: 2  . Years of education: 60  . Highest education level: Not on file  Occupational History  .  Occupation: Retired   Scientific laboratory technician  . Financial resource strain: Not on file  . Food insecurity:    Worry: Not on file    Inability: Not on file  . Transportation needs:    Medical: Not on file    Non-medical: Not on file  Tobacco Use  . Smoking status: Former Smoker    Years: 50.00    Types: Cigarettes    Last attempt to quit: 12/03/1993    Years since quitting: 23.7  . Smokeless tobacco: Never Used  Substance and Sexual Activity  . Alcohol use: Yes  . Drug use: No  . Sexual activity: Not on file  Lifestyle  . Physical activity:    Days per week: Not on file    Minutes per session: Not on file  . Stress: Not on file  Relationships  . Social connections:    Talks on phone: Not on  file    Gets together: Not on file    Attends religious service: Not on file    Active member of club or organization: Not on file    Attends meetings of clubs or organizations: Not on file    Relationship status: Not on file  . Intimate partner violence:    Fear of current or ex partner: Not on file    Emotionally abused: Not on file    Physically abused: Not on file    Forced sexual activity: Not on file  Other Topics Concern  . Not on file  Social History Narrative   Patient lives at home alone.    Patient is widowed.    Patient has 2 children.    Patient has a high school education.    Patient is right handed.    Patient is retired.       Family History  Problem Relation Age of Onset  . Cancer Sister      Review of Systems Positive for shortness of breath, low back pain, weight change, sore throat, itching, feet swelling, joint stiffness, nasal congestion   Constitutional: negative for anorexia, fevers and sweats  Eyes: negative for irritation, redness and visual disturbance  Ears, nose, mouth, throat, and face: negative for earaches, epistaxis Respiratory: negative for cough, , sputum and wheezing  Cardiovascular: negative for chest pain,  orthopnea, palpitations and syncope  Gastrointestinal: negative for abdominal pain, constipation, diarrhea, melena, nausea and vomiting  Genitourinary:negative for dysuria, frequency and hematuria  Hematologic/lymphatic: negative for bleeding, easy bruising and lymphadenopathy  Musculoskeletal:negative for arthralgias, muscle weakness and stiff joints  Neurological: negative for coordination problems, gait problems, headaches and weakness  Endocrine: negative for diabetic symptoms including polydipsia, polyuria and weight loss     Objective:   Physical Exam  Gen. Pleasant, obese, in no distress, normal affect ENT - no lesions, no post nasal drip, class 2-3 airway Neck: No JVD, no thyromegaly, no carotid bruits Lungs: no use  of accessory muscles, no dullness to percussion, decreased rt base without rales or rhonchi  Cardiovascular: Rhythm regular, heart sounds  normal, no murmurs or gallops, 1+ peripheral edema Lt > Rt Abdomen: soft and non-tender, no hepatosplenomegaly, BS normal. Musculoskeletal: No deformities, no cyanosis or clubbing Neuro:  alert, non focal, no tremors       Assessment & Plan:

## 2017-08-15 NOTE — Assessment & Plan Note (Signed)
He does seem to have passed a kidney stone and that may have accounted for his back pain too, but not common to have a reactive pleural effusion

## 2017-08-15 NOTE — Telephone Encounter (Signed)
Called and spoke with pt regarding CT chest stat completed today Advised pt once results are in, s/o will call him once RA review CT Pt verbalized understanding.  Routing message to cherina to f/u with pt next week.

## 2017-08-18 ENCOUNTER — Telehealth: Payer: Self-pay | Admitting: Pulmonary Disease

## 2017-08-18 DIAGNOSIS — R0602 Shortness of breath: Secondary | ICD-10-CM

## 2017-08-18 DIAGNOSIS — J90 Pleural effusion, not elsewhere classified: Secondary | ICD-10-CM

## 2017-08-18 NOTE — Telephone Encounter (Signed)
Per chart, orders have been entered and he is aware.

## 2017-08-18 NOTE — Telephone Encounter (Signed)
Orders placed for pt to have echo and thoracentesis. Pt is aware these orders had been placed. Nothing further needed.

## 2017-08-19 ENCOUNTER — Ambulatory Visit (HOSPITAL_COMMUNITY): Payer: Medicare Other | Attending: Cardiology

## 2017-08-19 ENCOUNTER — Other Ambulatory Visit: Payer: Self-pay

## 2017-08-19 DIAGNOSIS — Z6835 Body mass index (BMI) 35.0-35.9, adult: Secondary | ICD-10-CM | POA: Insufficient documentation

## 2017-08-19 DIAGNOSIS — R0602 Shortness of breath: Secondary | ICD-10-CM | POA: Diagnosis not present

## 2017-08-19 DIAGNOSIS — E669 Obesity, unspecified: Secondary | ICD-10-CM | POA: Diagnosis not present

## 2017-08-19 DIAGNOSIS — N189 Chronic kidney disease, unspecified: Secondary | ICD-10-CM | POA: Diagnosis not present

## 2017-08-19 DIAGNOSIS — I129 Hypertensive chronic kidney disease with stage 1 through stage 4 chronic kidney disease, or unspecified chronic kidney disease: Secondary | ICD-10-CM | POA: Diagnosis not present

## 2017-08-19 MED ORDER — PERFLUTREN LIPID MICROSPHERE
1.0000 mL | INTRAVENOUS | Status: AC | PRN
  Administered 2017-08-19: 2 mL via INTRAVENOUS

## 2017-08-20 ENCOUNTER — Telehealth: Payer: Self-pay | Admitting: Pulmonary Disease

## 2017-08-20 NOTE — Telephone Encounter (Signed)
RA, please advise if you have already signed this order. Thanks!

## 2017-08-21 ENCOUNTER — Ambulatory Visit (HOSPITAL_COMMUNITY)
Admission: RE | Admit: 2017-08-21 | Discharge: 2017-08-21 | Disposition: A | Discharge status: Home / Self Care | Payer: Medicare Other | Source: Ambulatory Visit | Attending: Pulmonary Disease | Admitting: Pulmonary Disease

## 2017-08-21 ENCOUNTER — Ambulatory Visit (HOSPITAL_COMMUNITY): Payer: Medicare Other

## 2017-08-21 ENCOUNTER — Ambulatory Visit (HOSPITAL_COMMUNITY)
Admission: RE | Admit: 2017-08-21 | Discharge: 2017-08-21 | Disposition: A | Discharge status: Home / Self Care | Payer: Medicare Other | Source: Ambulatory Visit | Attending: Student | Admitting: Student

## 2017-08-21 DIAGNOSIS — J90 Pleural effusion, not elsewhere classified: Secondary | ICD-10-CM | POA: Diagnosis present

## 2017-08-21 DIAGNOSIS — Z9889 Other specified postprocedural states: Secondary | ICD-10-CM

## 2017-08-21 LAB — BODY FLUID CELL COUNT WITH DIFFERENTIAL
Eos, Fluid: 24 %
Lymphs, Fluid: 15 %
Monocyte-Macrophage-Serous Fluid: 56 % (ref 50–90)
NEUTROPHIL FLUID: 5 % (ref 0–25)
WBC FLUID: 2229 uL — AB (ref 0–1000)

## 2017-08-21 LAB — LACTATE DEHYDROGENASE, PLEURAL OR PERITONEAL FLUID: LD, Fluid: 699 U/L — ABNORMAL HIGH (ref 3–23)

## 2017-08-21 LAB — GLUCOSE, PLEURAL OR PERITONEAL FLUID: GLUCOSE FL: 78 mg/dL

## 2017-08-21 LAB — PROTEIN, PLEURAL OR PERITONEAL FLUID: Total protein, fluid: 3.9 g/dL

## 2017-08-21 MED ORDER — LIDOCAINE HCL 1 % IJ SOLN
INTRAMUSCULAR | Status: AC
  Filled 2017-08-21: qty 10

## 2017-08-21 NOTE — Telephone Encounter (Signed)
done

## 2017-08-21 NOTE — Telephone Encounter (Signed)
RA please advise 

## 2017-08-21 NOTE — Procedures (Signed)
PROCEDURE SUMMARY:  Successful image-guided right thoracentesis. Yielded 1.75 liters of clear gold fluid. Patient tolerated procedure well. No immediate complications.  Specimen was sent for labs. CXR ordered.  Lavone Barrientes PA-C 08/21/2017 3:10 PM

## 2017-08-25 ENCOUNTER — Telehealth: Payer: Self-pay | Admitting: Internal Medicine

## 2017-08-25 ENCOUNTER — Encounter: Payer: Self-pay | Admitting: Internal Medicine

## 2017-08-25 ENCOUNTER — Encounter: Payer: Self-pay | Admitting: Pulmonary Disease

## 2017-08-25 ENCOUNTER — Ambulatory Visit (INDEPENDENT_AMBULATORY_CARE_PROVIDER_SITE_OTHER): Payer: Medicare Other | Admitting: Pulmonary Disease

## 2017-08-25 DIAGNOSIS — J90 Pleural effusion, not elsewhere classified: Secondary | ICD-10-CM

## 2017-08-25 NOTE — Progress Notes (Deleted)
   Subjective:    Patient ID: Alex Cherry, male    DOB: [birth year], 84 y.o.   MRN: [id number]  HPI    Review of Systems     Objective:   Physical Exam        Assessment & Plan:

## 2017-08-25 NOTE — Telephone Encounter (Signed)
New referral received from Dr. Elsworth Soho for lung cancer. Pt has been scheduled to see Dr. Julien Nordmann on 8/13 at 2:15pm with an arrival time at 1:45pm for labs. Lft the pt the appt date and time on his voicemail. I also notified the referring office of the appt to notify the pt as well. Letter mailed.

## 2017-08-25 NOTE — Progress Notes (Addendum)
Alex Cherry    [id number]    1933-10-26  Primary Care Physician:Elkins, Curt Jews, MD  Referring Physician: Leonard Downing, MD [address] [place], [place] [address]  Chief complaint:  Pleural effusion  HPI:  Alex Cherry is a 84yo M w/ PMH of recurrent nephrolithiasis, prostatitis, thryoid nodules s/p iodone presenting to the clinic for follow up of his pleural effusion. He was seen last week and underwent CTA for flank pain and shortness of breath and was found to have right-sided pleural effusion. He was sent for thoracocentesis and TTE. He stated that he has had both of those tests done and feels '100% better.' He is still endorsing his right sided flank pain and has productive cough with white sputum requiring albuterol use 2x during the last week, but feels his breathing has improved significantly. He states he has not noticed any bleeding or leakage from his thoracocentesis site. Denies any F/N/V/D/C. Denies chest pain, shortness of breath, wheeze, hemoptysis.  Outpatient Encounter Medications as of 08/25/2017  Medication Sig  . albuterol (PROVENTIL HFA;VENTOLIN HFA) 108 (90 Base) MCG/ACT inhaler Inhale 2 puffs into the lungs every 6 (six) hours as needed for wheezing or shortness of breath.  . cholecalciferol (VITAMIN D) 1000 units tablet Take 1,000 Units by mouth once a week.   . diltiazem (DILACOR XR) 180 MG 24 hr capsule Take 180 mg by mouth daily.  Marland Kitchen doxycycline (VIBRA-TABS) 100 MG tablet Take 100 mg by mouth 2 (two) times daily.  Marland Kitchen levothyroxine (SYNTHROID, LEVOTHROID) 75 MCG tablet Take 75 mcg by mouth daily before breakfast.   No facility-administered encounter medications on file as of 08/25/2017.     Allergies as of 08/25/2017 - Review Complete 08/25/2017  Allergen Reaction Noted  . Diphenhydramine Hives, Itching, and Nausea Only 11/26/2010  . Penicillins Hives, Itching, and Nausea Only 11/26/2010  . Septra [sulfamethoxazole w/trimethoprim  (co-trimoxazole)] Hives, Itching, and Nausea Only 11/26/2010    Past Medical History:  Diagnosis Date  . Arthritis   . Asthma    as a child  . Blood in urine   . Chronic kidney disease    rt ureteral calculus  . Constipation   . Cough   . Difficulty urinating   . Easy bruising   . Hearing loss   . Hyperparathyroidism   . Hypertension    Denies any other heart  conditions, no chest pain or arrtthy.  . Nasal congestion   . Trouble swallowing   . Wheezing     Past Surgical History:  Procedure Laterality Date  . CYSTOSCOPY W/ URETERAL STENT PLACEMENT  2009   with stone removal  . LITHOTRIPSY  2010, 2012    two procedures performed  . tumor on thumb  30 yr ago    Family History  Problem Relation Age of Onset  . Cancer Sister     Social History   Socioeconomic History  . Marital status: Widowed    Spouse name: Not on file  . Number of children: 2  . Years of education: 29  . Highest education level: Not on file  Occupational History  . Occupation: Retired   Scientific laboratory technician  . Financial resource strain: Not on file  . Food insecurity:    Worry: Not on file    Inability: Not on file  . Transportation needs:    Medical: Not on file    Non-medical: Not on file  Tobacco Use  . Smoking status:  Former Smoker    Years: 50.00    Types: Cigarettes    Last attempt to quit: 12/03/1993    Years since quitting: 23.7  . Smokeless tobacco: Never Used  Substance and Sexual Activity  . Alcohol use: Yes  . Drug use: No  . Sexual activity: Not on file  Lifestyle  . Physical activity:    Days per week: Not on file    Minutes per session: Not on file  . Stress: Not on file  Relationships  . Social connections:    Talks on phone: Not on file    Gets together: Not on file    Attends religious service: Not on file    Active member of club or organization: Not on file    Attends meetings of clubs or organizations: Not on file    Relationship status: Not on file  .  Intimate partner violence:    Fear of current or ex partner: Not on file    Emotionally abused: Not on file    Physically abused: Not on file    Forced sexual activity: Not on file  Other Topics Concern  . Not on file  Social History Narrative   Patient lives at home alone.    Patient is widowed.    Patient has 2 children.    Patient has a high school education.    Patient is right handed.    Patient is retired.     Review of systems: Review of Systems  Constitutional: Negative for fever and chills.  HENT: Negative.   Eyes: Negative for blurred vision.  Respiratory: as per HPI  Cardiovascular: Negative for chest pain and palpitations.  Gastrointestinal: Negative for vomiting, diarrhea, blood per rectum. Genitourinary: Negative for dysuria, urgency, frequency and hematuria.  Musculoskeletal: Negative for myalgias, back pain and joint pain.  Skin:No rash, lesions, itching Neurological: Negative for dizziness, tremors, focal weakness, seizures and loss of consciousness.  Endo/Heme/Allergies: Negative for environmental allergies.  Psychiatric/Behavioral: Negative for depression, suicidal ideas and hallucinations.  All other systems reviewed and are negative.  Physical Exam: Blood pressure 116/78, pulse 64, height 5\' 7"  (1.702 m), weight 223 lb (101.2 kg), SpO2 93 %. Gen:      No acute distress HEENT:  EOMI, sclera anicteric Neck:     No masses; no thyromegaly Lungs:    Clear to auscultation bilaterally; mild dullness of percussion on right lower lobe CV:         Regular rate and rhythm; no murmurs Abd:      + bowel sounds; soft, non-tender; no palpable masses, no distension Ext:    No edema; adequate peripheral perfusion Skin:      Warm and dry; Thoracocentesis site healing well without bleeding, exudates, erythema or warmth Neuro: alert and oriented x 3 Psych: normal mood and affect  Data Reviewed: TEE on 08/19/17 show EF of 60~65%  Assessment:  Alex Cherry  presenting with malignant pleural effusion 2/2 primary lung adenocarcinoma vs metastatic disease.  - Thoracocentesis on 08/21/17 show 1.75 gold fluid with 2229 WBC w/ 56% monocytes - Pathologist's preliminary findings consistent with primary lung adenocarcinoma - Hx of thyroid nodules treated with radioactive iodine treatment  Plan/Recommendations: - PET scan to r/o other primary malignancy - Referral for oncology - F/u in 4 weeks for repeat Chest X-ray  Gilberto Better, PGY1 [facility] 08/25/2017, 9:09 AM  CC: Leonard Downing, *  Independently examined pt, evaluated data & formulated above care plan with resident  Remote smoker, presented with a large right pleural effusion  he had been following up with urology for prostatitis and renal calculi CT angiogram did not show any evidence of pulmonary embolism, Echo showed normal LV function.  exam today shows slightly decreased breath sounds on right but otherwise clear lungs, no pedal edema,, No JVD  Thoracenteses confirmed a mononuclear exudate, 1.75 L of fluid was removed , I called cytology for results today and unfortunately this shows adenocarcinoma. I gave this diagnosis to Alex Cherry and he was accepting of this. I reviewed CT angiogram which shows multiple small up to 5 mm nodules, no evidence of a large primary lesion.  Pathology does favor a lung primary. He has a history of thyroid nodules treated with radioactive iodine Hence we will proceed with a PET scan and referred to oncology  Total time spent with the patient including discussion of results was 30 minutes  Rakesh V. Elsworth Soho MD

## 2017-08-25 NOTE — Patient Instructions (Signed)
Schedule PET scan Referred to oncology-Dr. Julien Nordmann

## 2017-08-28 ENCOUNTER — Encounter: Payer: Self-pay | Admitting: *Deleted

## 2017-08-28 NOTE — Progress Notes (Signed)
Oncology Nurse Navigator Documentation  Oncology Nurse Navigator Flowsheets 08/28/2017  Navigator Location [facility]  Navigator Encounter Type Other/per cancer conference foundation one requested.  I update pathology  Abnormal Finding Date 08/14/2017  Confirmed Diagnosis Date 08/21/2017  Treatment Phase Pre-Tx/Tx Discussion  Barriers/Navigation Needs Coordination of Care  Interventions Coordination of Care  Coordination of Care Other  Acuity Level 2  Acuity Level 2 Other  Time Spent with Patient 30

## 2017-08-29 ENCOUNTER — Telehealth: Payer: Self-pay | Admitting: Internal Medicine

## 2017-08-29 NOTE — Telephone Encounter (Signed)
Pt cld to cancel appt with Dr. Julien Nordmann on 8/13. He states he will be going to [facility] for treatment.

## 2017-09-02 ENCOUNTER — Inpatient Hospital Stay: Payer: Medicare Other | Admitting: Internal Medicine

## 2017-09-02 ENCOUNTER — Inpatient Hospital Stay: Payer: Medicare Other

## 2017-09-03 ENCOUNTER — Ambulatory Visit (HOSPITAL_COMMUNITY): Payer: Medicare Other

## 2017-09-12 ENCOUNTER — Encounter (HOSPITAL_COMMUNITY): Payer: Self-pay | Admitting: Internal Medicine

## 2017-09-12 ENCOUNTER — Telehealth: Payer: Self-pay | Admitting: *Deleted

## 2017-09-12 NOTE — Telephone Encounter (Signed)
Oncology Nurse Navigator Documentation  Oncology Nurse Navigator Flowsheets 09/12/2017  Navigator Location [facility]  Navigator Encounter Type Telephone/I was updated that Alex Cherry does not want to be seen here and has been referral already to [facility].  I called to make sure he was being seen.  I was unable to reach him but did leave a vm message for him to call me with my name and phone number.   Telephone Outgoing Call  Treatment Phase Pre-Tx/Tx Discussion  Interventions Other  Acuity Level 1  Time Spent with Patient 30

## 2017-09-26 ENCOUNTER — Encounter: Payer: Self-pay | Admitting: *Deleted

## 2017-09-26 NOTE — Progress Notes (Signed)
Oncology Nurse Navigator Documentation  Oncology Nurse Navigator Flowsheets 09/26/2017  Navigator Location [facility]  Navigator Encounter Type Other/I followed up on Alex Cherry schedule.  In looking at care everywhere in EMR, I do not see he has been seen at [facility]  I called patients PCP office to update we have not been successful getting in touch with patient.  Office staff will update Dr. Arelia Sneddon.   Abnormal Finding Date 08/14/2017  Confirmed Diagnosis Date 08/21/2017  Treatment Phase Pre-Tx/Tx Discussion  Barriers/Navigation Needs Coordination of Care  Interventions Coordination of Care  Coordination of Care Other  Acuity Level 1  Time Spent with Patient 15

## 2017-09-26 NOTE — Progress Notes (Signed)
I received a call from patient's PCP office. I was left a vm message stating Mr. Alex Cherry is being seen for cancer care at [facility].

## 2017-10-20 ENCOUNTER — Ambulatory Visit: Payer: Medicare Other | Admitting: Pulmonary Disease

## 2019-02-12 ENCOUNTER — Ambulatory Visit: Payer: Medicare Other

## 2019-04-28 IMAGING — US US THORACENTESIS ASP PLEURAL SPACE W/IMG GUIDE
1 series · 5 of 5 positions shown · non-contrast
Comparison: none

INDICATION: Patient with history of shortness of breath and right pleural
effusion. Request is made for diagnostic and therapeutic right
thoracentesis.

[Series 1: us thoracentesis asp pleural space w/img guide · 5 of 5 slices shown]
[im 1/5]
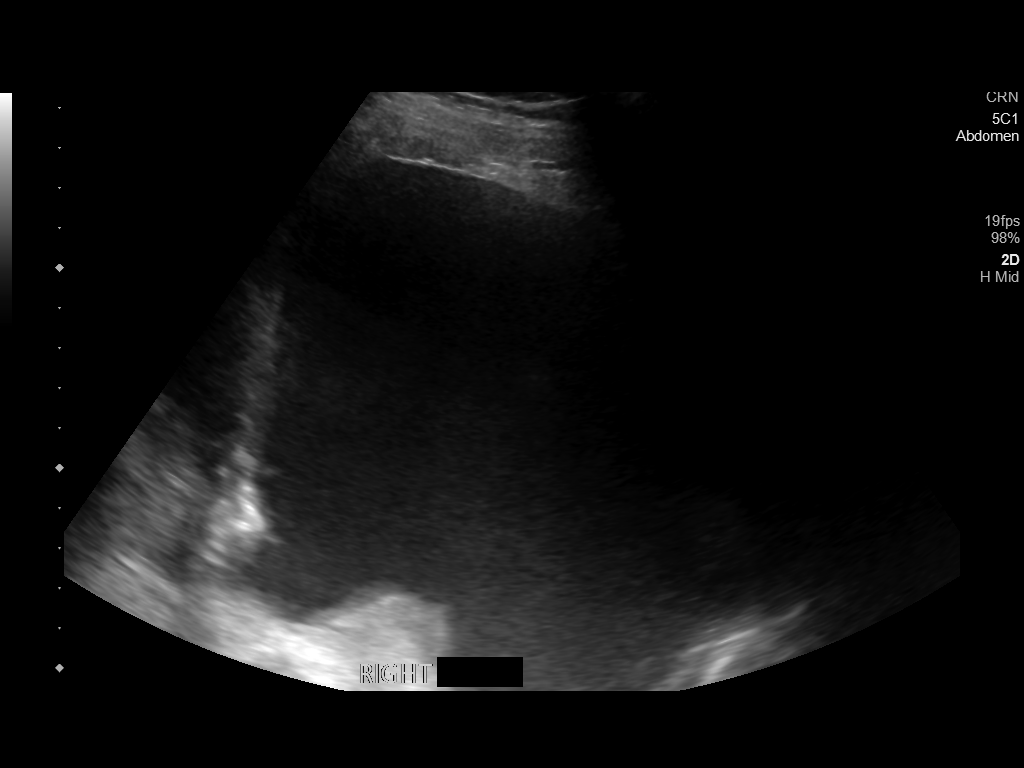
[im 2/5]
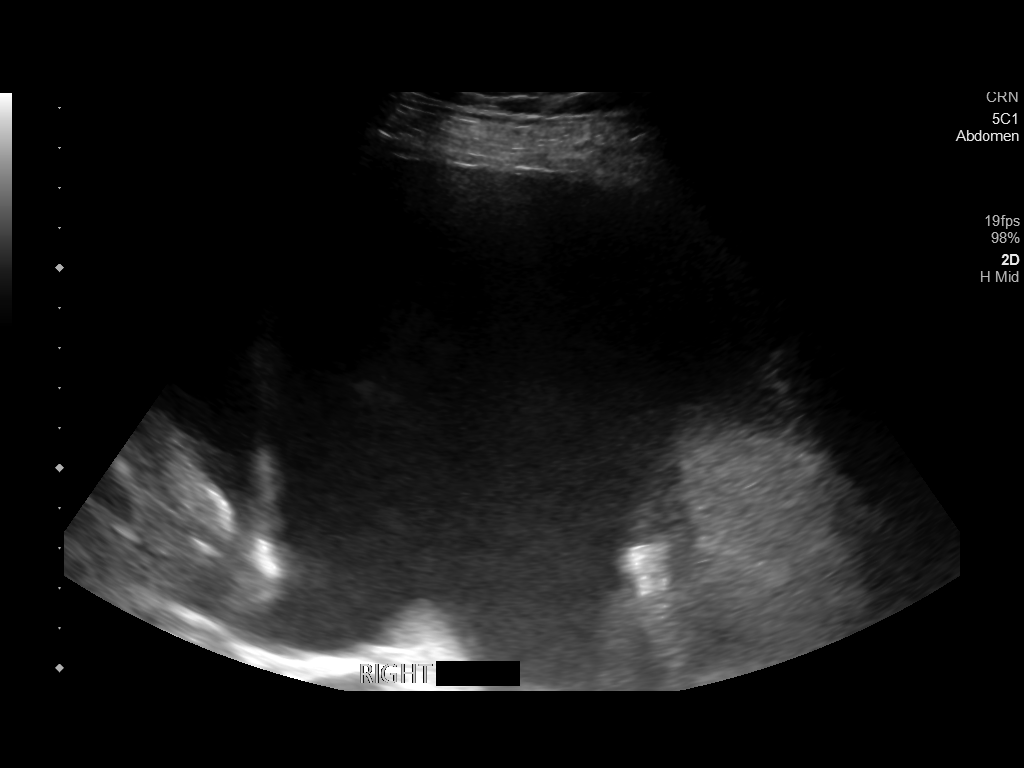
[im 3/5]
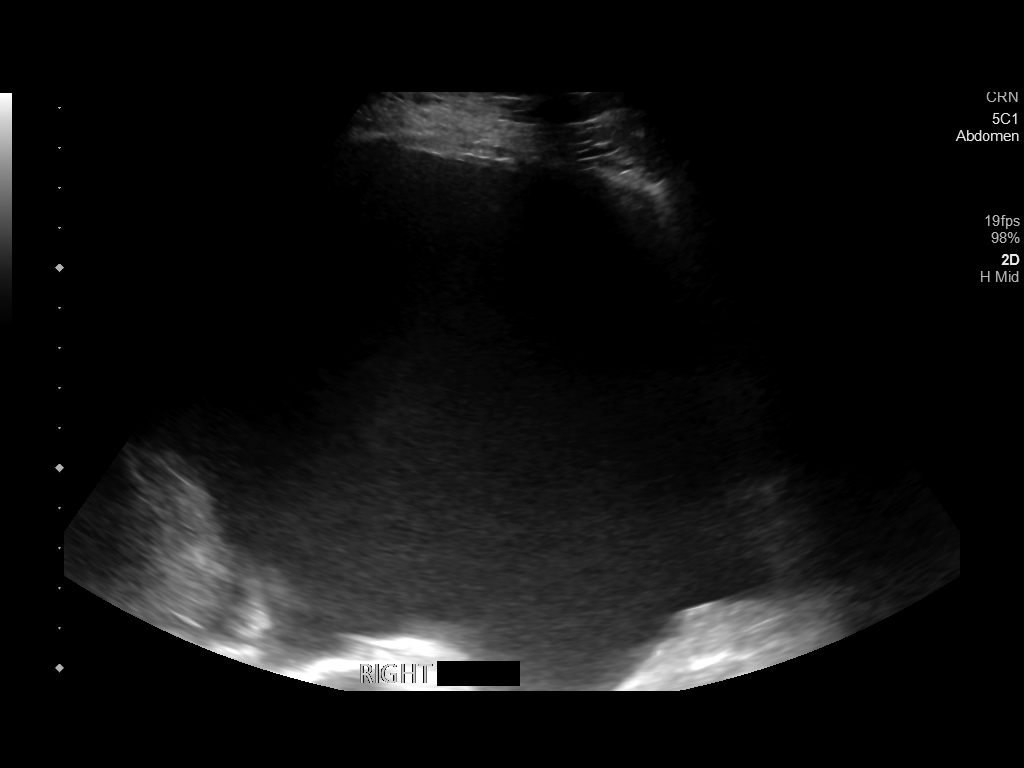
[im 4/5]
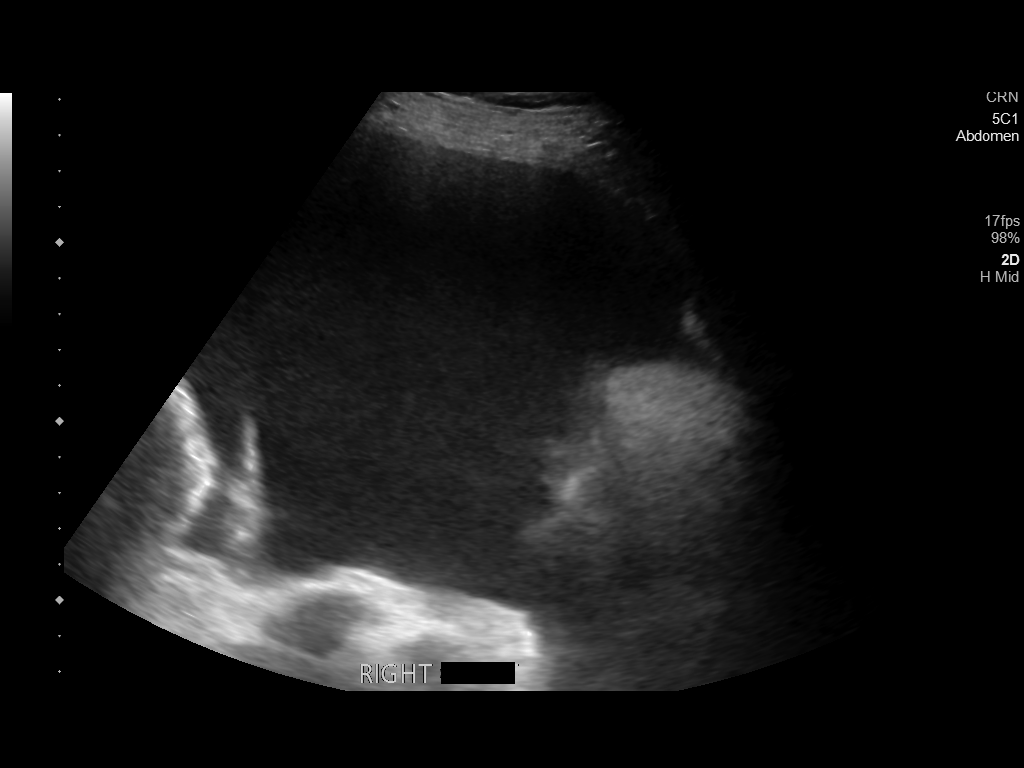
[im 5/5]
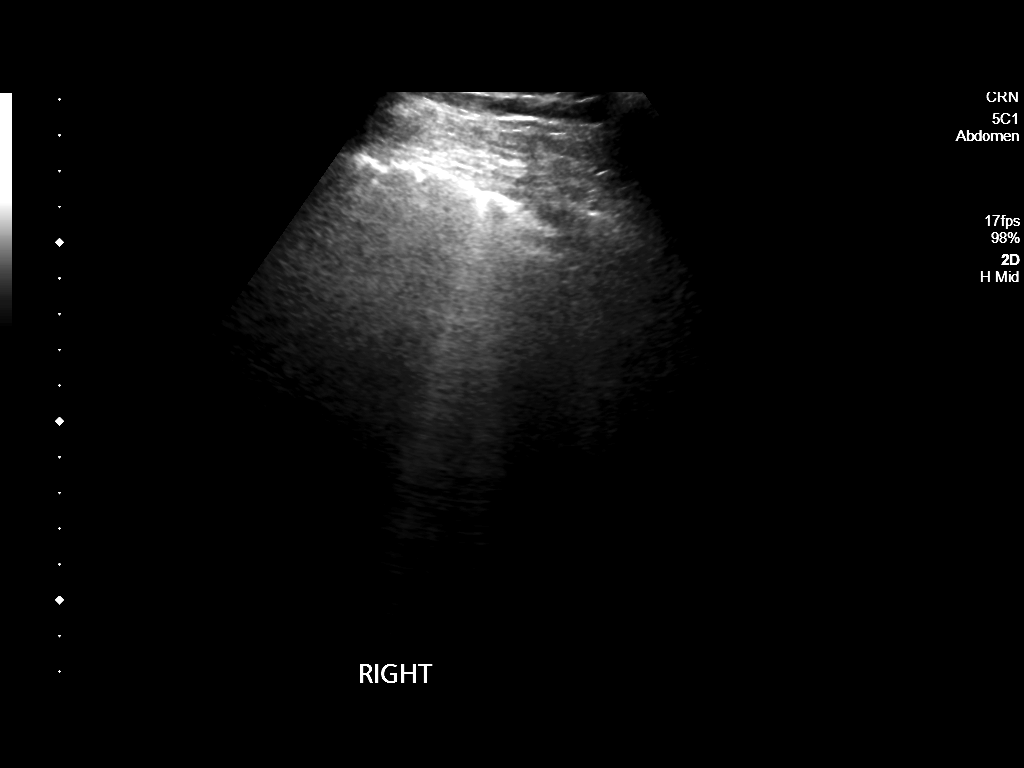

[5 of 5 positions shown; findings below may reference images not displayed]

EXAM:
ULTRASOUND GUIDED DIAGNOSTIC AND THERAPEUTIC RIGHT THORACENTESIS

MEDICATIONS:
10 mL of 2% lidocaine

COMPLICATIONS:
None immediate.

PROCEDURE:
An ultrasound guided thoracentesis was thoroughly discussed with the
patient and questions answered. The benefits, risks, alternatives
and complications were also discussed. The patient understands and
wishes to proceed with the procedure. Written consent was obtained.

Ultrasound was performed to localize and mark an adequate pocket of
fluid in the right chest. The area was then prepped and draped in
the normal sterile fashion. 2% Lidocaine was used for local
anesthesia. Under ultrasound guidance a 6 Fr Safe-T-Centesis
catheter was introduced. Thoracentesis was performed. The catheter
was removed and a dressing applied.
FINDINGS: A total of approximately 1.75 L of clear gold fluid was removed.
Samples were sent to the laboratory as requested by the clinical
team.
IMPRESSION: Successful ultrasound guided right thoracentesis yielding 1.75 L of
pleural fluid.

## 2019-04-28 IMAGING — DX DG CHEST 1V
1 series · 1 of 1 positions shown · non-contrast
Comparison: 08/14/2017

CLINICAL DATA: Status post right-sided thoracentesis

EXAM:
CHEST  1 VIEW

[chest pa]
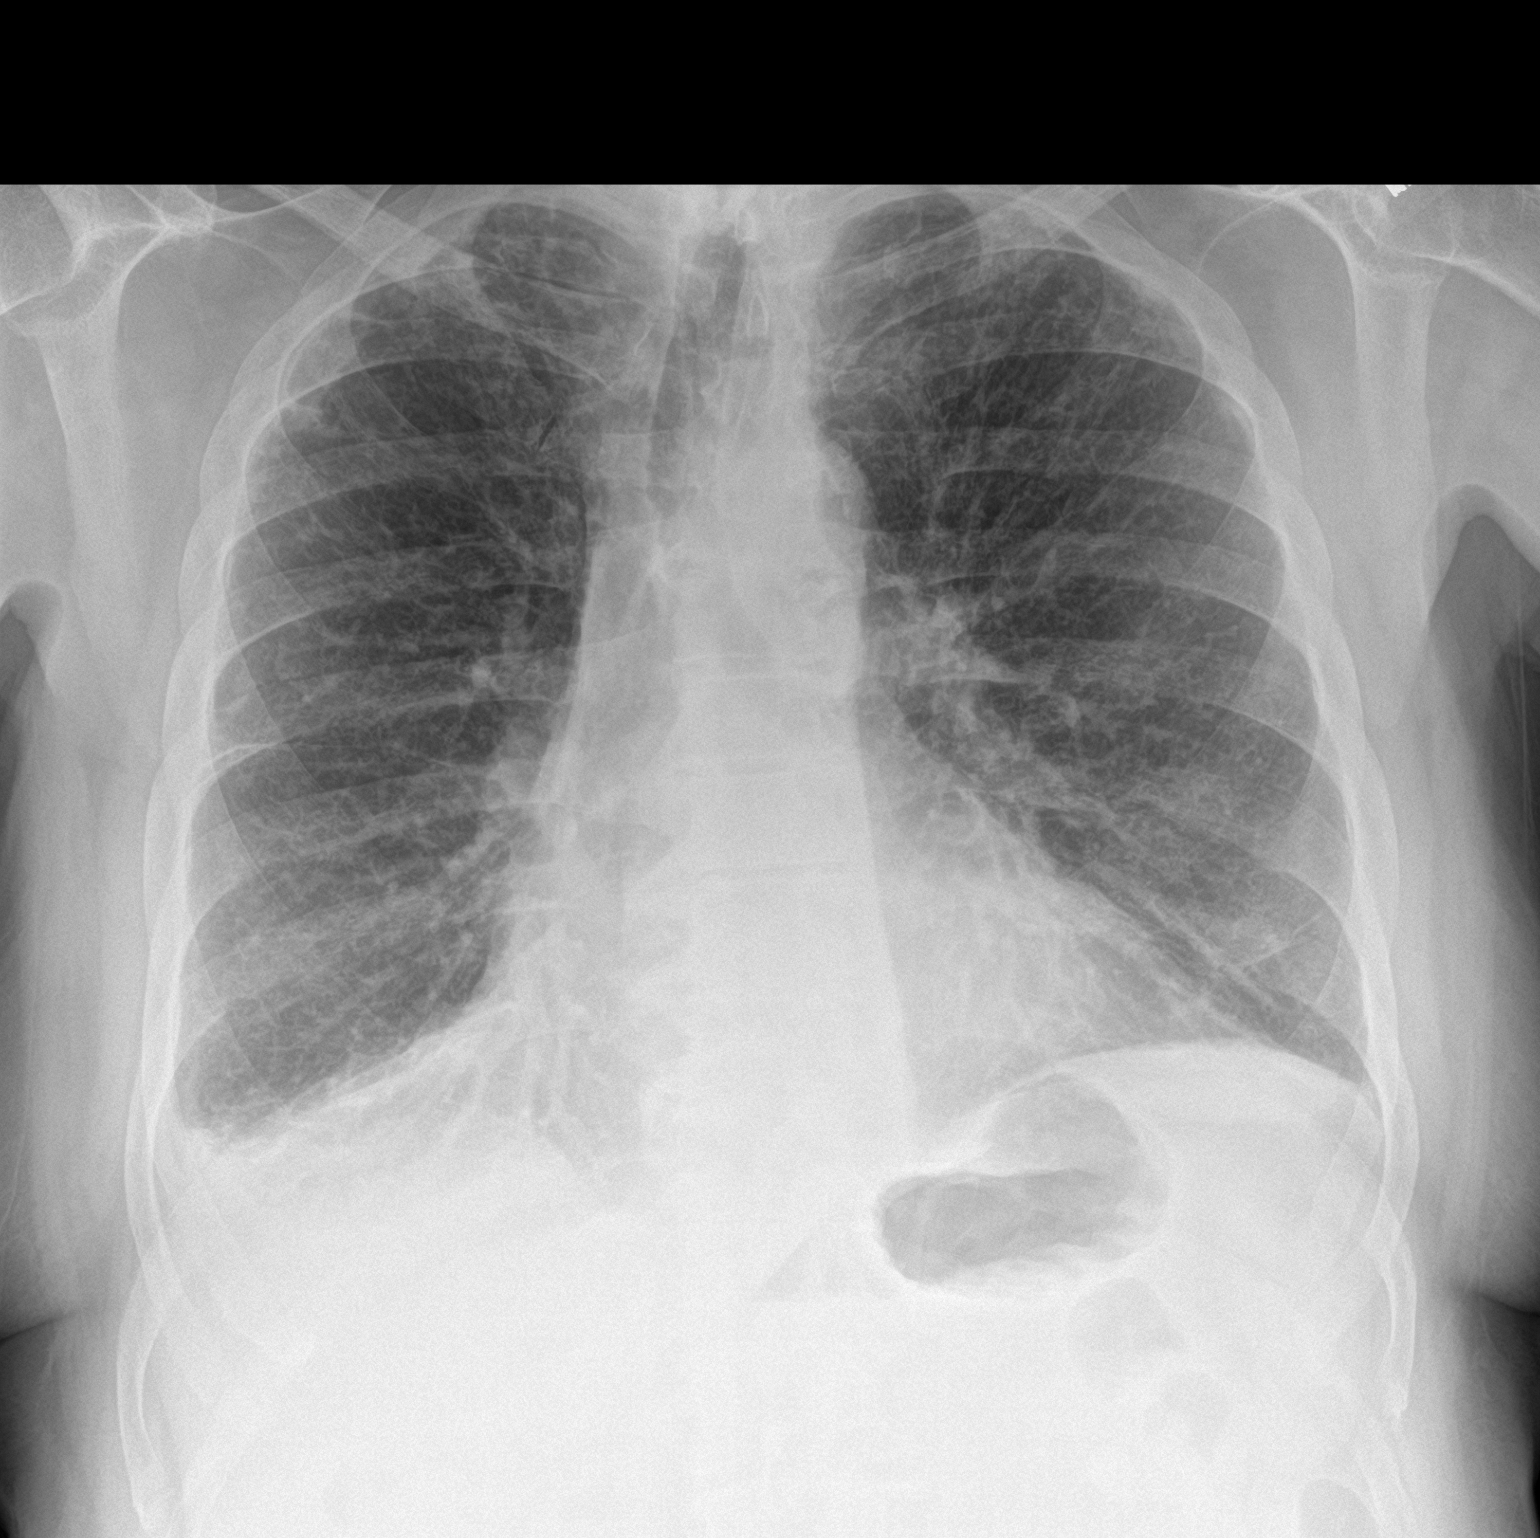

[1 of 1 positions shown; findings below may reference images not displayed]

FINDINGS: Cardiac shadow is stable. Improved aeration in the right lung is
noted following thoracentesis. No pneumothorax is noted. Chronic
changes are noted bilaterally. No bony abnormality is seen.
IMPRESSION: No pneumothorax following right thoracentesis.

## 2019-07-24 ENCOUNTER — Encounter: Payer: Self-pay | Admitting: Emergency Medicine

## 2019-07-24 ENCOUNTER — Other Ambulatory Visit: Payer: Self-pay

## 2019-07-24 ENCOUNTER — Ambulatory Visit
Admission: EM | Admit: 2019-07-24 | Discharge: 2019-07-24 | Disposition: A | Discharge status: Home / Self Care | Payer: Medicare Other | Attending: Emergency Medicine | Admitting: Emergency Medicine

## 2019-07-24 ENCOUNTER — Ambulatory Visit (INDEPENDENT_AMBULATORY_CARE_PROVIDER_SITE_OTHER): Payer: Medicare Other

## 2019-07-24 DIAGNOSIS — M25511 Pain in right shoulder: Secondary | ICD-10-CM

## 2019-07-24 DIAGNOSIS — W19XXXA Unspecified fall, initial encounter: Secondary | ICD-10-CM

## 2019-07-24 DIAGNOSIS — M79641 Pain in right hand: Secondary | ICD-10-CM

## 2019-07-24 DIAGNOSIS — W1830XA Fall on same level, unspecified, initial encounter: Secondary | ICD-10-CM | POA: Diagnosis not present

## 2019-07-24 NOTE — Discharge Instructions (Addendum)

## 2019-07-24 NOTE — ED Triage Notes (Signed)
Pt presents to [facility] for assessment of right arm pain (hand to shoulder) after tripping/sliding after his foot slipped changing from the carpet to the hardwood.  Patient states he caught himself on his right side/with his right arm.  Redness and swelling noticed to right hand.

## 2019-07-24 NOTE — ED Provider Notes (Signed)
[facility]    CSN: [id number] Arrival date & time: 07/24/19  1517      History   Chief Complaint Chief Complaint  Patient presents with  . Fall    HPI NOVA EVETT is a 86 y.o. male with extensive medical history as outlined below including hypertension presenting for right shoulder and hand pain s/p fall.  Patient provides history: States he fell last night from ground-level.  No head trauma, LOC.  Patient denying chest pain, difficulty breathing.  Does endorse limited ROM secondary pain in right shoulder and hand.  No recent change in medications: Denies blood thinner use/anticoagulation.   Past Medical History:  Diagnosis Date  . Arthritis   . Asthma    as a child  . Blood in urine   . Chronic kidney disease    rt ureteral calculus  . Constipation   . Cough   . Difficulty urinating   . Easy bruising   . Hearing loss   . Hyperparathyroidism   . Hypertension    Denies any other heart  conditions, no chest pain or arrtthy.  . Nasal congestion   . Trouble swallowing   . Wheezing     Patient Active Problem List   Diagnosis Date Noted  . Pleural effusion on right 08/15/2017  . Multiple thyroid nodules 05/07/2012  . Hyperparathyroidism, primary ([place]) 06/12/2011  . Right ureteral stone 12/06/2010    Past Surgical History:  Procedure Laterality Date  . CYSTOSCOPY W/ URETERAL STENT PLACEMENT  2009   with stone removal  . LITHOTRIPSY  2010, 2012    two procedures performed  . tumor on thumb  30 yr ago       Home Medications    Prior to Admission medications   Medication Sig Start Date End Date Taking? Authorizing Provider  albuterol (PROVENTIL HFA;VENTOLIN HFA) 108 (90 Base) MCG/ACT inhaler Inhale 2 puffs into the lungs every 6 (six) hours as needed for wheezing or shortness of breath.    [provider]  cholecalciferol (VITAMIN D) 1000 units tablet Take 1,000 Units by mouth once a week.     [provider]  diltiazem  (DILACOR XR) 180 MG 24 hr capsule Take 180 mg by mouth daily.    [provider]  levothyroxine (SYNTHROID, LEVOTHROID) 75 MCG tablet Take 75 mcg by mouth daily before breakfast.    [provider]    Family History Family History  Problem Relation Age of Onset  . Cancer Sister     Social History Social History   Tobacco Use  . Smoking status: Former Smoker    Years: 50.00    Types: Cigarettes    Quit date: 12/03/1993    Years since quitting: 25.6  . Smokeless tobacco: Never Used  Substance Use Topics  . Alcohol use: Yes  . Drug use: No     Allergies   Diphenhydramine, Penicillins, and Septra [sulfamethoxazole w/trimethoprim (co-trimoxazole)]   Review of Systems As per HPI   Physical Exam Triage Vital Signs ED Triage Vitals  Enc Vitals Group     BP      Pulse      Resp      Temp      Temp src      SpO2      Weight      Height      Head Circumference      Peak Flow      Pain Score  Pain Loc      Pain Edu?      Excl. in [place]?    No data found.  Updated Vital Signs BP (!) 166/67 (BP Location: Left Arm)   Pulse 66   Temp 98.1 F (36.7 C) (Oral)   Resp 18   SpO2 94%   Visual Acuity Right Eye Distance:   Left Eye Distance:   Bilateral Distance:    Right Eye Near:   Left Eye Near:    Bilateral Near:     Physical Exam Constitutional:      General: He is not in acute distress. HENT:     Head: Normocephalic and atraumatic.  Eyes:     General: No scleral icterus.    Pupils: Pupils are equal, round, and reactive to light.  Cardiovascular:     Rate and Rhythm: Normal rate.  Pulmonary:     Effort: Pulmonary effort is normal. No respiratory distress.     Breath sounds: No wheezing.  Musculoskeletal:     Cervical back: Normal range of motion.     Comments: Decreased ROM of right upper extremity second to pain.  Pain is more over [facility] joint as opposed to posterior scapula.  Sensation intact.  Distal extremity with swelling over  right wrist extending into hand.  No laceration.  NVI.  No snuffbox tenderness.  Skin:    Coloration: Skin is not jaundiced or pale.  Neurological:     Mental Status: He is alert and oriented to person, place, and time.      UC Treatments / Results  Labs (all labs ordered are listed, but only abnormal results are displayed) Labs Reviewed - No data to display  EKG   Radiology DG Shoulder Right  Result Date: 07/24/2019 CLINICAL DATA:  Fall. RIGHT shoulder pain after falling earlier today. Pain in the [facility] joint. EXAM: RIGHT SHOULDER - 2+ VIEW COMPARISON:  Chest x-ray on 08/21/2017 FINDINGS: There is no acute fracture or subluxation. Minimal subacromial narrowing which can be associated with chronic rotator cuff injury. Lung apices show chronic fibrotic changes which are stable. IMPRESSION: No evidence for acute abnormality. Mild subacromial narrowing, likely chronic. Electronically Signed   By: Nolon Nations M.D.   On: 07/24/2019 16:05   DG Hand Complete Right  Result Date: 07/24/2019 CLINICAL DATA:  Right hand pain after a fall today. Initial encounter. EXAM: RIGHT HAND - COMPLETE 3+ VIEW COMPARISON:  None. FINDINGS: Soft tissues are swollen but no fracture or dislocation is identified. Scattered osteoarthritis appears worst at the scaphoid trapezium trapezoid joint. IMPRESSION: Soft tissue swelling without acute bony or joint abnormality. Scattered osteoarthritis. Electronically Signed   By: Inge Rise M.D.   On: 07/24/2019 16:20    Procedures Procedures (including critical care time)  Medications Ordered in UC Medications - No data to display  Initial Impression / Assessment and Plan / UC Course  I have reviewed the triage vital signs and the nursing notes.  Pertinent labs & imaging results that were available during my care of the patient were reviewed by me and considered in my medical decision making (see chart for details).     Patient febrile, nontoxic in office  today.  Upper extremity neurovascularly intact.  X-ray of shoulder and hand in office, reviewed by radiology: Negative.  Applied Ace wrap in office which patient tolerated well.  Provided contact formation for orthopedic follow-up if needed.  Return precautions discussed, patient verbalized understanding and is agreeable to plan. Final Clinical Impressions(s) / UC  Diagnoses   Final diagnoses:  Fall, initial encounter  Acute pain of right shoulder  Pain of right hand     Discharge Instructions     Recommend RICE: rest, ice, compression, elevation as needed for pain.    Heat therapy (hot compress, warm wash rag, hot showers, etc.) can help relax muscles and soothe muscle aches. Cold therapy (ice packs) can be used to help swelling both after injury and after prolonged use of areas of chronic pain/aches.  For pain: recommend 350 mg-1000 mg of Tylenol (acetaminophen) and/or 200 mg - 800 mg of Advil (ibuprofen, Motrin) every 8 hours as needed.  May alternate between the two throughout the day as they are generally safe to take together.  DO NOT exceed more than 3000 mg of Tylenol or 3200 mg of ibuprofen in a 24 hour period as this could damage your stomach, kidneys, liver, or increase your bleeding risk.    ED Prescriptions    None     PDMP not reviewed this encounter.   [place], [place], [place] 07/24/19 1624

## 2020-05-11 ENCOUNTER — Telehealth: Payer: Self-pay

## 2020-05-11 NOTE — Telephone Encounter (Signed)
Spoke with patient's daughter [name] and scheduled an in-person Palliative Consult for 05/22/20 @ 12:30PM  COVID screening was negative. No pets in home. Patient's daughter and grandson are staying with patient currently. Patient is receiving services through [facility] also.  Consent obtained; updated Outlook/Netsmart/Team List and Epic.  Family is aware they may be receiving a call from NP the day before or day of to confirm appointment.

## 2020-05-14 ENCOUNTER — Inpatient Hospital Stay (HOSPITAL_COMMUNITY)
Admission: EM | Admit: 2020-05-14 | Discharge: 2020-05-17 | DRG: 871 | Disposition: A | Discharge status: Hospice | Payer: Medicare Other | Attending: Internal Medicine | Admitting: Internal Medicine

## 2020-05-14 ENCOUNTER — Emergency Department (HOSPITAL_COMMUNITY): Payer: Medicare Other

## 2020-05-14 ENCOUNTER — Other Ambulatory Visit: Payer: Self-pay

## 2020-05-14 ENCOUNTER — Encounter (HOSPITAL_COMMUNITY): Payer: Self-pay | Admitting: Emergency Medicine

## 2020-05-14 ENCOUNTER — Inpatient Hospital Stay (HOSPITAL_COMMUNITY): Payer: Medicare Other

## 2020-05-14 DIAGNOSIS — D638 Anemia in other chronic diseases classified elsewhere: Secondary | ICD-10-CM | POA: Diagnosis present

## 2020-05-14 DIAGNOSIS — R778 Other specified abnormalities of plasma proteins: Secondary | ICD-10-CM | POA: Diagnosis present

## 2020-05-14 DIAGNOSIS — C7989 Secondary malignant neoplasm of other specified sites: Secondary | ICD-10-CM | POA: Diagnosis present

## 2020-05-14 DIAGNOSIS — J441 Chronic obstructive pulmonary disease with (acute) exacerbation: Secondary | ICD-10-CM

## 2020-05-14 DIAGNOSIS — Z87891 Personal history of nicotine dependence: Secondary | ICD-10-CM | POA: Diagnosis not present

## 2020-05-14 DIAGNOSIS — A419 Sepsis, unspecified organism: Secondary | ICD-10-CM | POA: Diagnosis present

## 2020-05-14 DIAGNOSIS — J9621 Acute and chronic respiratory failure with hypoxia: Secondary | ICD-10-CM | POA: Diagnosis present

## 2020-05-14 DIAGNOSIS — Z66 Do not resuscitate: Secondary | ICD-10-CM | POA: Diagnosis not present

## 2020-05-14 DIAGNOSIS — Z881 Allergy status to other antibiotic agents status: Secondary | ICD-10-CM

## 2020-05-14 DIAGNOSIS — J439 Emphysema, unspecified: Secondary | ICD-10-CM | POA: Diagnosis present

## 2020-05-14 DIAGNOSIS — Z789 Other specified health status: Secondary | ICD-10-CM | POA: Diagnosis not present

## 2020-05-14 DIAGNOSIS — Z8701 Personal history of pneumonia (recurrent): Secondary | ICD-10-CM

## 2020-05-14 DIAGNOSIS — C3490 Malignant neoplasm of unspecified part of unspecified bronchus or lung: Secondary | ICD-10-CM | POA: Diagnosis not present

## 2020-05-14 DIAGNOSIS — Z79890 Hormone replacement therapy (postmenopausal): Secondary | ICD-10-CM

## 2020-05-14 DIAGNOSIS — N179 Acute kidney failure, unspecified: Secondary | ICD-10-CM

## 2020-05-14 DIAGNOSIS — J910 Malignant pleural effusion: Secondary | ICD-10-CM | POA: Diagnosis present

## 2020-05-14 DIAGNOSIS — J159 Unspecified bacterial pneumonia: Secondary | ICD-10-CM | POA: Diagnosis not present

## 2020-05-14 DIAGNOSIS — Z7401 Bed confinement status: Secondary | ICD-10-CM

## 2020-05-14 DIAGNOSIS — Z9109 Other allergy status, other than to drugs and biological substances: Secondary | ICD-10-CM | POA: Diagnosis not present

## 2020-05-14 DIAGNOSIS — C3491 Malignant neoplasm of unspecified part of right bronchus or lung: Secondary | ICD-10-CM | POA: Diagnosis not present

## 2020-05-14 DIAGNOSIS — Z7189 Other specified counseling: Secondary | ICD-10-CM

## 2020-05-14 DIAGNOSIS — Z20822 Contact with and (suspected) exposure to covid-19: Secondary | ICD-10-CM | POA: Diagnosis present

## 2020-05-14 DIAGNOSIS — R300 Dysuria: Secondary | ICD-10-CM | POA: Diagnosis not present

## 2020-05-14 DIAGNOSIS — E278 Other specified disorders of adrenal gland: Secondary | ICD-10-CM | POA: Diagnosis present

## 2020-05-14 DIAGNOSIS — A4181 Sepsis due to Enterococcus: Secondary | ICD-10-CM | POA: Diagnosis present

## 2020-05-14 DIAGNOSIS — R5381 Other malaise: Secondary | ICD-10-CM

## 2020-05-14 DIAGNOSIS — M47816 Spondylosis without myelopathy or radiculopathy, lumbar region: Secondary | ICD-10-CM | POA: Diagnosis present

## 2020-05-14 DIAGNOSIS — J9622 Acute and chronic respiratory failure with hypercapnia: Secondary | ICD-10-CM | POA: Diagnosis present

## 2020-05-14 DIAGNOSIS — R7989 Other specified abnormal findings of blood chemistry: Secondary | ICD-10-CM

## 2020-05-14 DIAGNOSIS — B952 Enterococcus as the cause of diseases classified elsewhere: Secondary | ICD-10-CM | POA: Diagnosis not present

## 2020-05-14 DIAGNOSIS — R0600 Dyspnea, unspecified: Secondary | ICD-10-CM | POA: Diagnosis not present

## 2020-05-14 DIAGNOSIS — Z79899 Other long term (current) drug therapy: Secondary | ICD-10-CM

## 2020-05-14 DIAGNOSIS — R7881 Bacteremia: Secondary | ICD-10-CM | POA: Diagnosis not present

## 2020-05-14 DIAGNOSIS — J189 Pneumonia, unspecified organism: Secondary | ICD-10-CM | POA: Diagnosis present

## 2020-05-14 DIAGNOSIS — N390 Urinary tract infection, site not specified: Secondary | ICD-10-CM | POA: Diagnosis present

## 2020-05-14 DIAGNOSIS — Z515 Encounter for palliative care: Secondary | ICD-10-CM | POA: Diagnosis not present

## 2020-05-14 DIAGNOSIS — Y95 Nosocomial condition: Secondary | ICD-10-CM | POA: Diagnosis present

## 2020-05-14 DIAGNOSIS — M48061 Spinal stenosis, lumbar region without neurogenic claudication: Secondary | ICD-10-CM | POA: Diagnosis present

## 2020-05-14 DIAGNOSIS — E213 Hyperparathyroidism, unspecified: Secondary | ICD-10-CM | POA: Diagnosis present

## 2020-05-14 DIAGNOSIS — E860 Dehydration: Secondary | ICD-10-CM | POA: Diagnosis present

## 2020-05-14 DIAGNOSIS — C3411 Malignant neoplasm of upper lobe, right bronchus or lung: Secondary | ICD-10-CM | POA: Diagnosis present

## 2020-05-14 DIAGNOSIS — Z880 Allergy status to penicillin: Secondary | ICD-10-CM

## 2020-05-14 DIAGNOSIS — J449 Chronic obstructive pulmonary disease, unspecified: Secondary | ICD-10-CM | POA: Diagnosis present

## 2020-05-14 DIAGNOSIS — R0603 Acute respiratory distress: Secondary | ICD-10-CM | POA: Diagnosis not present

## 2020-05-14 DIAGNOSIS — Z87442 Personal history of urinary calculi: Secondary | ICD-10-CM

## 2020-05-14 DIAGNOSIS — C7972 Secondary malignant neoplasm of left adrenal gland: Secondary | ICD-10-CM | POA: Diagnosis present

## 2020-05-14 DIAGNOSIS — I10 Essential (primary) hypertension: Secondary | ICD-10-CM | POA: Diagnosis present

## 2020-05-14 DIAGNOSIS — R6520 Severe sepsis without septic shock: Secondary | ICD-10-CM | POA: Diagnosis present

## 2020-05-14 DIAGNOSIS — Z8673 Personal history of transient ischemic attack (TIA), and cerebral infarction without residual deficits: Secondary | ICD-10-CM

## 2020-05-14 DIAGNOSIS — Z7982 Long term (current) use of aspirin: Secondary | ICD-10-CM

## 2020-05-14 DIAGNOSIS — C7951 Secondary malignant neoplasm of bone: Secondary | ICD-10-CM | POA: Diagnosis present

## 2020-05-14 DIAGNOSIS — Z8719 Personal history of other diseases of the digestive system: Secondary | ICD-10-CM

## 2020-05-14 HISTORY — DX: Malignant (primary) neoplasm, unspecified: C80.1

## 2020-05-14 LAB — CBC WITH DIFFERENTIAL/PLATELET
Abs Immature Granulocytes: 0.15 10*3/uL — ABNORMAL HIGH (ref 0.00–0.07)
Basophils Absolute: 0 10*3/uL (ref 0.0–0.1)
Basophils Relative: 0 %
Eosinophils Absolute: 0.1 10*3/uL (ref 0.0–0.5)
Eosinophils Relative: 0 %
HCT: 39.3 % (ref 39.0–52.0)
Hemoglobin: 11.7 g/dL — ABNORMAL LOW (ref 13.0–17.0)
Immature Granulocytes: 1 %
Lymphocytes Relative: 1 %
Lymphs Abs: 0.2 10*3/uL — ABNORMAL LOW (ref 0.7–4.0)
MCH: 32.1 pg (ref 26.0–34.0)
MCHC: 29.8 g/dL — ABNORMAL LOW (ref 30.0–36.0)
MCV: 107.7 fL — ABNORMAL HIGH (ref 80.0–100.0)
Monocytes Absolute: 0.3 10*3/uL (ref 0.1–1.0)
Monocytes Relative: 1 %
Neutro Abs: 17.9 10*3/uL — ABNORMAL HIGH (ref 1.7–7.7)
Neutrophils Relative %: 97 %
Platelets: 225 10*3/uL (ref 150–400)
RBC: 3.65 MIL/uL — ABNORMAL LOW (ref 4.22–5.81)
RDW: 17.5 % — ABNORMAL HIGH (ref 11.5–15.5)
WBC: 18.6 10*3/uL — ABNORMAL HIGH (ref 4.0–10.5)
nRBC: 0.3 % — ABNORMAL HIGH (ref 0.0–0.2)

## 2020-05-14 LAB — BLOOD GAS, ARTERIAL
Acid-Base Excess: 8.6 mmol/L — ABNORMAL HIGH (ref 0.0–2.0)
Acid-Base Excess: 9.5 mmol/L — ABNORMAL HIGH (ref 0.0–2.0)
Bicarbonate: 34.7 mmol/L — ABNORMAL HIGH (ref 20.0–28.0)
Bicarbonate: 35.4 mmol/L — ABNORMAL HIGH (ref 20.0–28.0)
FIO2: 100
FIO2: 35
O2 Saturation: 99.6 %
O2 Saturation: 94.7 %
Patient temperature: 98.6
Patient temperature: 98.6
pCO2 arterial: 58.2 mmHg — ABNORMAL HIGH (ref 32.0–48.0)
pCO2 arterial: 58.4 mmHg — ABNORMAL HIGH (ref 32.0–48.0)
pH, Arterial: 7.393 (ref 7.350–7.450)
pH, Arterial: 7.399 (ref 7.350–7.450)
pO2, Arterial: 182 mmHg — ABNORMAL HIGH (ref 83.0–108.0)
pO2, Arterial: 72.3 mmHg — ABNORMAL LOW (ref 83.0–108.0)

## 2020-05-14 LAB — CBC
HCT: 35.3 % — ABNORMAL LOW (ref 39.0–52.0)
Hemoglobin: 10.8 g/dL — ABNORMAL LOW (ref 13.0–17.0)
MCH: 32.7 pg (ref 26.0–34.0)
MCHC: 30.6 g/dL (ref 30.0–36.0)
MCV: 107 fL — ABNORMAL HIGH (ref 80.0–100.0)
Platelets: 205 10*3/uL (ref 150–400)
RBC: 3.3 MIL/uL — ABNORMAL LOW (ref 4.22–5.81)
RDW: 17.6 % — ABNORMAL HIGH (ref 11.5–15.5)
WBC: 29.7 10*3/uL — ABNORMAL HIGH (ref 4.0–10.5)
nRBC: 0 % (ref 0.0–0.2)

## 2020-05-14 LAB — URINALYSIS, ROUTINE W REFLEX MICROSCOPIC
Bilirubin Urine: NEGATIVE
Glucose, UA: NEGATIVE mg/dL
Hgb urine dipstick: NEGATIVE
Ketones, ur: NEGATIVE mg/dL
Nitrite: NEGATIVE
Protein, ur: 30 mg/dL — AB
Specific Gravity, Urine: 1.011 (ref 1.005–1.030)
WBC, UA: >50 WBC/hpf — ABNORMAL HIGH (ref 0–5)
pH: 7 (ref 5.0–8.0)

## 2020-05-14 LAB — RESPIRATORY PANEL BY PCR

## 2020-05-14 LAB — MRSA PCR SCREENING: MRSA by PCR: NEGATIVE

## 2020-05-14 LAB — RESP PANEL BY RT-PCR (FLU A&B, COVID) ARPGX2
Influenza A by PCR: NEGATIVE
Influenza B by PCR: NEGATIVE
SARS Coronavirus 2 by RT PCR: NEGATIVE

## 2020-05-14 LAB — COMPREHENSIVE METABOLIC PANEL
ALT: 20 U/L (ref 0–44)
AST: 23 U/L (ref 15–41)
Albumin: 2.8 g/dL — ABNORMAL LOW (ref 3.5–5.0)
Alkaline Phosphatase: 94 U/L (ref 38–126)
Anion gap: 12 (ref 5–15)
BUN: 18 mg/dL (ref 8–23)
CO2: 30 mmol/L (ref 22–32)
Calcium: 10.6 mg/dL — ABNORMAL HIGH (ref 8.9–10.3)
Chloride: 102 mmol/L (ref 98–111)
Creatinine, Ser: 1.37 mg/dL — ABNORMAL HIGH (ref 0.61–1.24)
GFR, Estimated: 51 mL/min — ABNORMAL LOW (ref >60)
Glucose, Bld: 112 mg/dL — ABNORMAL HIGH (ref 70–99)
Potassium: 3.1 mmol/L — ABNORMAL LOW (ref 3.5–5.1)
Sodium: 144 mmol/L (ref 135–145)
Total Bilirubin: 0.9 mg/dL (ref 0.3–1.2)
Total Protein: 6.3 g/dL — ABNORMAL LOW (ref 6.5–8.1)

## 2020-05-14 LAB — LACTIC ACID, PLASMA
Lactic Acid, Venous: 2.1 mmol/L (ref 0.5–1.9)
Lactic Acid, Venous: 1.4 mmol/L (ref 0.5–1.9)

## 2020-05-14 LAB — CREATININE, SERUM
Creatinine, Ser: 1.39 mg/dL — ABNORMAL HIGH (ref 0.61–1.24)
GFR, Estimated: 50 mL/min — ABNORMAL LOW (ref >60)

## 2020-05-14 LAB — PROTIME-INR
INR: 1.1 (ref 0.8–1.2)
Prothrombin Time: 13.8 seconds (ref 11.4–15.2)

## 2020-05-14 LAB — TROPONIN I (HIGH SENSITIVITY)
Troponin I (High Sensitivity): 74 ng/L — ABNORMAL HIGH (ref <18)
Troponin I (High Sensitivity): 173 ng/L (ref <18)

## 2020-05-14 LAB — STREP PNEUMONIAE URINARY ANTIGEN: Strep Pneumo Urinary Antigen: NEGATIVE

## 2020-05-14 LAB — BRAIN NATRIURETIC PEPTIDE: B Natriuretic Peptide: 159.8 pg/mL — ABNORMAL HIGH (ref 0.0–100.0)

## 2020-05-14 MED ORDER — SODIUM CHLORIDE 0.9 % IV SOLN
250.0000 mL | INTRAVENOUS | Status: DC | PRN
  Administered 2020-05-16: 250 mL via INTRAVENOUS

## 2020-05-14 MED ORDER — VANCOMYCIN HCL 750 MG/150ML IV SOLN
750.0000 mg | INTRAVENOUS | Status: AC
  Administered 2020-05-14: 750 mg via INTRAVENOUS
  Filled 2020-05-14 (×3): qty 150

## 2020-05-14 MED ORDER — CHLORHEXIDINE GLUCONATE CLOTH 2 % EX PADS
6.0000 | MEDICATED_PAD | Freq: Every day | CUTANEOUS | Status: DC
  Administered 2020-05-14 – 2020-05-16 (×3): 6 via TOPICAL

## 2020-05-14 MED ORDER — HYDROCHLOROTHIAZIDE 12.5 MG PO CAPS
12.5000 mg | ORAL_CAPSULE | Freq: Every day | ORAL | Status: DC
  Administered 2020-05-15 – 2020-05-17 (×3): 12.5 mg via ORAL
  Filled 2020-05-14 (×3): qty 1

## 2020-05-14 MED ORDER — ASPIRIN EC 81 MG PO TBEC
81.0000 mg | DELAYED_RELEASE_TABLET | Freq: Every day | ORAL | Status: DC
  Administered 2020-05-15 – 2020-05-17 (×3): 81 mg via ORAL
  Filled 2020-05-14 (×3): qty 1

## 2020-05-14 MED ORDER — DILTIAZEM HCL ER 180 MG PO CP24
180.0000 mg | ORAL_CAPSULE | Freq: Every day | ORAL | Status: DC
  Filled 2020-05-14 (×2): qty 1

## 2020-05-14 MED ORDER — ENOXAPARIN SODIUM 40 MG/0.4ML ~~LOC~~ SOLN
40.0000 mg | SUBCUTANEOUS | Status: DC
  Administered 2020-05-14 – 2020-05-16 (×3): 40 mg via SUBCUTANEOUS
  Filled 2020-05-14 (×3): qty 0.4

## 2020-05-14 MED ORDER — LEVOFLOXACIN IN D5W 750 MG/150ML IV SOLN: 750.0000 mg | INTRAVENOUS | Status: DC

## 2020-05-14 MED ORDER — VANCOMYCIN HCL IN DEXTROSE 1-5 GM/200ML-% IV SOLN
1000.0000 mg | Freq: Once | INTRAVENOUS | Status: AC
  Administered 2020-05-14: 1000 mg via INTRAVENOUS
  Filled 2020-05-14: qty 200

## 2020-05-14 MED ORDER — SODIUM CHLORIDE 0.9% FLUSH
3.0000 mL | Freq: Two times a day (BID) | INTRAVENOUS | Status: DC
  Administered 2020-05-14 (×2): 3 mL via INTRAVENOUS

## 2020-05-14 MED ORDER — IOHEXOL 350 MG/ML SOLN
80.0000 mL | Freq: Once | INTRAVENOUS | Status: AC | PRN
  Administered 2020-05-14: 80 mL via INTRAVENOUS

## 2020-05-14 MED ORDER — ALBUTEROL SULFATE (2.5 MG/3ML) 0.083% IN NEBU: 2.5000 mg | INHALATION_SOLUTION | RESPIRATORY_TRACT | Status: DC | PRN

## 2020-05-14 MED ORDER — VANCOMYCIN HCL 750 MG/150ML IV SOLN
750.0000 mg | INTRAVENOUS | Status: DC
  Filled 2020-05-14: qty 150

## 2020-05-14 MED ORDER — METHYLPREDNISOLONE SODIUM SUCC 40 MG IJ SOLR
40.0000 mg | Freq: Three times a day (TID) | INTRAMUSCULAR | Status: DC
  Administered 2020-05-14 – 2020-05-17 (×9): 40 mg via INTRAVENOUS
  Filled 2020-05-14 (×8): qty 1

## 2020-05-14 MED ORDER — ORAL CARE MOUTH RINSE
15.0000 mL | Freq: Two times a day (BID) | OROMUCOSAL | Status: DC
  Administered 2020-05-14 – 2020-05-17 (×6): 15 mL via OROMUCOSAL

## 2020-05-14 MED ORDER — SODIUM CHLORIDE 0.9 % IV SOLN
2.0000 g | Freq: Two times a day (BID) | INTRAVENOUS | Status: DC
  Administered 2020-05-14 – 2020-05-15 (×2): 2 g via INTRAVENOUS
  Filled 2020-05-14 (×2): qty 2

## 2020-05-14 MED ORDER — LEVOFLOXACIN IN D5W 750 MG/150ML IV SOLN
750.0000 mg | Freq: Once | INTRAVENOUS | Status: AC
  Administered 2020-05-14: 750 mg via INTRAVENOUS
  Filled 2020-05-14: qty 150

## 2020-05-14 MED ORDER — CHLORHEXIDINE GLUCONATE 0.12 % MT SOLN
15.0000 mL | Freq: Two times a day (BID) | OROMUCOSAL | Status: DC
  Administered 2020-05-14 – 2020-05-17 (×7): 15 mL via OROMUCOSAL
  Filled 2020-05-14 (×5): qty 15

## 2020-05-14 MED ORDER — VANCOMYCIN HCL IN DEXTROSE 1-5 GM/200ML-% IV SOLN
1000.0000 mg | INTRAVENOUS | Status: DC
  Filled 2020-05-14: qty 200

## 2020-05-14 MED ORDER — SODIUM CHLORIDE 0.9% FLUSH: 3.0000 mL | INTRAVENOUS | Status: DC | PRN

## 2020-05-14 MED ORDER — DEXTROSE IN LACTATED RINGERS 5 % IV SOLN: INTRAVENOUS | Status: DC

## 2020-05-14 MED ORDER — LACTATED RINGERS IV BOLUS
500.0000 mL | Freq: Once | INTRAVENOUS | Status: AC
  Administered 2020-05-14: 500 mL via INTRAVENOUS

## 2020-05-14 MED ORDER — LEVOTHYROXINE SODIUM 75 MCG PO TABS
75.0000 ug | ORAL_TABLET | Freq: Every day | ORAL | Status: DC
  Administered 2020-05-15 – 2020-05-17 (×3): 75 ug via ORAL
  Filled 2020-05-14 (×4): qty 1

## 2020-05-14 NOTE — ED Notes (Signed)
respiratory at bedside to help with transport to CT. CT unavailable at this time for patient to come. Will let this RN know when they can scan him.

## 2020-05-14 NOTE — ED Notes (Signed)
Pt returned from CT. Pt being transported onto floor at this time with respiratory and another ED RN.

## 2020-05-14 NOTE — ED Provider Notes (Signed)
[facility] DEPT Provider Note   CSN: [id number] Arrival date & time: 05/14/20  0809     History Chief Complaint  Patient presents with  . Dizziness  . Shortness of Breath    Alex Cherry is a 87 y.o. male.  87 year old male with prior medical history as detailed below presents for evaluation.  Patient with significant history of metastatic stage IV lung cancer.  Patient is on home O2 at baseline.  Reportedly he wears somewhere between 2 and 5 L at baseline.  He reports increased shortness of breath over the last 24 hours.  EMS reports sats on 4 L at home were in the low 70s.  Patient is denying fever or chest pain.  He reports gradual increase in his dyspnea over the last day or 2.  He reports recent use of Moxifloxacin for presumed pneumonia.  Patient is full code per report.  The history is provided by the patient and medical records.  Shortness of Breath Severity:  Moderate Onset quality:  Gradual Duration:  2 days Timing:  Constant Progression:  Worsening Chronicity:  New Relieved by:  Nothing Worsened by:  Nothing      Past Medical History:  Diagnosis Date  . Arthritis   . Asthma    as a child  . Blood in urine   . Chronic kidney disease    rt ureteral calculus  . Constipation   . Cough   . Difficulty urinating   . Easy bruising   . Hearing loss   . Hyperparathyroidism   . Hypertension    Denies any other heart  conditions, no chest pain or arrtthy.  . Nasal congestion   . Trouble swallowing   . Wheezing     Patient Active Problem List   Diagnosis Date Noted  . Pleural effusion on right 08/15/2017  . Multiple thyroid nodules 05/07/2012  . Hyperparathyroidism, primary ([place]) 06/12/2011  . Right ureteral stone 12/06/2010    Past Surgical History:  Procedure Laterality Date  . CYSTOSCOPY W/ URETERAL STENT PLACEMENT  2009   with stone removal  . LITHOTRIPSY  2010, 2012    two procedures performed  . tumor on thumb   30 yr ago       Family History  Problem Relation Age of Onset  . Cancer Sister     Social History   Tobacco Use  . Smoking status: Former Smoker    Years: 50.00    Types: Cigarettes    Quit date: 12/03/1993    Years since quitting: 26.4  . Smokeless tobacco: Never Used  Substance Use Topics  . Alcohol use: Yes  . Drug use: No    Home Medications Prior to Admission medications   Medication Sig Start Date End Date Taking? Authorizing Provider  albuterol (PROVENTIL HFA;VENTOLIN HFA) 108 (90 Base) MCG/ACT inhaler Inhale 2 puffs into the lungs every 6 (six) hours as needed for wheezing or shortness of breath.   Yes [provider]  aspirin EC 81 MG tablet Take 81 mg by mouth daily. Swallow whole.   Yes [provider]  Dextromethorphan-guaiFENesin (ROBAFEN DM PO) Take 20 mLs by mouth every 4 (four) hours.   Yes [provider]  hydrochlorothiazide (MICROZIDE) 12.5 MG capsule Take 12.5 mg by mouth daily. 12/22/19  Yes [provider]  levothyroxine (SYNTHROID, LEVOTHROID) 75 MCG tablet Take 75 mcg by mouth daily before breakfast.   Yes [provider]  Vitamin D, Ergocalciferol, (DRISDOL) 1.25 MG (50000  UNIT) CAPS capsule Take 50,000 Units by mouth every 7 (seven) days.   Yes [provider]  diltiazem (DILACOR XR) 180 MG 24 hr capsule Take 180 mg by mouth daily.    [provider]    Allergies    Diphenhydramine, Penicillins, Septra [sulfamethoxazole w/trimethoprim (co-trimoxazole)], and Moxifloxacin hcl  Review of Systems   Review of Systems  Respiratory: Positive for shortness of breath.   All other systems reviewed and are negative.   Physical Exam Updated Vital Signs BP 127/69   Pulse (!) 105   Temp 99 F (37.2 C) (Oral)   Resp 19   SpO2 93%   Physical Exam Vitals and nursing note reviewed.  Constitutional:      General: He is in acute distress.     Appearance: He is ill-appearing.  HENT:      Head: Normocephalic and atraumatic.  Eyes:     Conjunctiva/sclera: Conjunctivae normal.     Pupils: Pupils are equal, round, and reactive to light.  Cardiovascular:     Rate and Rhythm: Normal rate and regular rhythm.     Heart sounds: Normal heart sounds.  Pulmonary:     Effort: Tachypnea, accessory muscle usage and respiratory distress present.     Comments: Moderate resp distress noted with increased WOB and hypoxia on 5L [place] (75% pulse ox) Abdominal:     General: There is no distension.     Palpations: Abdomen is soft.     Tenderness: There is no abdominal tenderness.  Musculoskeletal:        General: No deformity. Normal range of motion.     Cervical back: Normal range of motion and neck supple.  Skin:    General: Skin is warm and dry.  Neurological:     General: No focal deficit present.     Mental Status: He is alert and oriented to person, place, and time.     ED Results / Procedures / Treatments   Labs (all labs ordered are listed, but only abnormal results are displayed) Labs Reviewed  BLOOD GAS, ARTERIAL - Abnormal; Notable for the following components:      Result Value   pCO2 arterial 58.2 (*)    pO2, Arterial 182 (*)    Bicarbonate 34.7 (*)    Acid-Base Excess 8.6 (*)    All other components within normal limits  LACTIC ACID, PLASMA - Abnormal; Notable for the following components:   Lactic Acid, Venous 2.1 (*)    All other components within normal limits  COMPREHENSIVE METABOLIC PANEL - Abnormal; Notable for the following components:   Potassium 3.1 (*)    Glucose, Bld 112 (*)    Creatinine, Ser 1.37 (*)    Calcium 10.6 (*)    Total Protein 6.3 (*)    Albumin 2.8 (*)    GFR, Estimated 51 (*)    All other components within normal limits  CBC WITH DIFFERENTIAL/PLATELET - Abnormal; Notable for the following components:   WBC 18.6 (*)    RBC 3.65 (*)    Hemoglobin 11.7 (*)    MCV 107.7 (*)    MCHC 29.8 (*)    RDW 17.5 (*)    nRBC 0.3 (*)    Neutro Abs  17.9 (*)    Lymphs Abs 0.2 (*)    Abs Immature Granulocytes 0.15 (*)    All other components within normal limits  TROPONIN I (HIGH SENSITIVITY) - Abnormal; Notable for the following components:   Troponin I (High Sensitivity) 74 (*)  All other components within normal limits  RESP PANEL BY RT-PCR (FLU A&B, COVID) ARPGX2  CULTURE, BLOOD (ROUTINE X 2)  CULTURE, BLOOD (ROUTINE X 2)  PROTIME-INR  URINALYSIS, ROUTINE W REFLEX MICROSCOPIC  LACTIC ACID, PLASMA  BRAIN NATRIURETIC PEPTIDE  TROPONIN I (HIGH SENSITIVITY)    EKG EKG Interpretation  Date/Time:  Sunday May 14 2020 08:26:51 EDT Ventricular Rate:  113 PR Interval:  150 QRS Duration: 83 QT Interval:  282 QTC Calculation: 387 R Axis:   40 Text Interpretation: Sinus tachycardia Nonspecific repol abnormality, lateral leads Confirmed by Dene Gentry [phone]) on 05/14/2020 8:50:34 AM   Radiology DG Chest Port 1 View  Result Date: 05/14/2020 CLINICAL DATA:  Pt on 2.5-3L O2 continuous but has to be increased when pt gets up. Pt has lung cancer. Pt comes in on NRB. Pt was treated for lung infection 3 weeks ago but had to be taken off medication due to AMS and increased dizziness. EXAM: PORTABLE CHEST - 1 VIEW COMPARISON:  08/21/2017 FINDINGS: Interval development of extensive airspace opacities throughout both lungs, with relative sparing in the right upper lobe, and more focal consolidation in the right mid lung. There is blunting of the left lateral costophrenic angle suggesting small effusion. Heart size remains normal. No pneumothorax. Vertebral endplate spurring at multiple levels in the mid thoracic spine. IMPRESSION: Interval development of extensive bilateral airspace infiltrates or edema. Electronically Signed   By: Lucrezia Europe M.D.   On: 05/14/2020 08:52    Procedures Procedures  CRITICAL CARE Performed by: Valarie Merino   Total critical care time: 45 minutes  Critical care time was exclusive of separately billable  procedures and treating other patients.  Critical care was necessary to treat or prevent imminent or life-threatening deterioration.  Critical care was time spent personally by me on the following activities: development of treatment plan with patient and/or surrogate as well as nursing, discussions with consultants, evaluation of patient's response to treatment, examination of patient, obtaining history from patient or surrogate, ordering and performing treatments and interventions, ordering and review of laboratory studies, ordering and review of radiographic studies, pulse oximetry and re-evaluation of patient's condition.  Medications Ordered in ED Medications  levofloxacin (LEVAQUIN) IVPB 750 mg (750 mg Intravenous New Bag/Given 05/14/20 0905)  vancomycin (VANCOCIN) IVPB 1000 mg/200 mL premix (0 mg Intravenous Stopped 05/14/20 1007)    ED Course  I have reviewed the triage vital signs and the nursing notes.  Pertinent labs & imaging results that were available during my care of the patient were reviewed by me and considered in my medical decision making (see chart for details).    MDM Rules/Calculators/A&P                          MDM  MSE complete  Alex Cherry was evaluated in Emergency Department on 05/14/2020 for the symptoms described in the history of present illness. He was evaluated in the context of the global COVID-19 pandemic, which necessitated consideration that the patient might be at risk for infection with the SARS-CoV-2 virus that causes COVID-19. Institutional protocols and algorithms that pertain to the evaluation of patients at risk for COVID-19 are in a state of rapid change based on information released by regulatory bodies including the CDC and federal and state organizations. These policies and algorithms were followed during the patient's care in the ED.  Patient is presenting with complaint of increasing shortness of breath and noted hypoxia at  home.  Work-up is strongly suggestive of worsening pneumonia in the setting of known metastatic lung cancer.  Patient is full code upon questioning today.  Patient was placed on BiPAP upon arrival.  He clinically appears improved with decreased work of breathing and improved O2 sats.  Antibiotics initiated in ED.  Patient will require admission for further work-up and treatment.  Hospitalist services were case and will evaluate for admission.  Patient and his daughter understand plan of care and need for admission.  Final Clinical Impression(s) / ED Diagnoses Final diagnoses:  Dyspnea, unspecified type    Rx / DC Orders ED Discharge Orders    None       Valarie Merino, MD 05/14/20 1109

## 2020-05-14 NOTE — ED Notes (Signed)
CT came to transport pt for scan. Called to make respiratory aware pt needing to transport to CT. will be coming to see patient.

## 2020-05-14 NOTE — Progress Notes (Signed)
Pharmacy Brief Note  87 y/o M with a h/o stage IV lung CA admitted with acute respiratory failure and sepsis due to PNA. Patient was started on Levaquin and vancomycin. Pharmacy consulted for cefepime in place of Levaquin.   Serum creatinine: 1.39 mg/dL (H) 05/14/20 1142 Estimated creatinine clearance: 41.8 mL/min (A)  Will initiate cefepime 2 g iv q 12 hours.   Thank you for allowing pharmacy to participate in this patient's care.    Ulice Dash D  05/14/20 4:05 PM

## 2020-05-14 NOTE — Consult Note (Signed)
NAME:  Alex Cherry, MRN:  [id number], DOB:  1933-06-19, LOS: 0 ADMISSION DATE:  05/14/2020, CONSULTATION DATE:  4/24/222 REFERRING MD:  Dr Derrill Kay, CHIEF COMPLAINT:   Acute resp failure  History of Present Illness:   87 year old male.  History is provided by the daughter, review of the medical records and also Dr. Shanon Brow of [facility].  He is a retired Archivist.  He Has Advanced Stage IV Adenocarcinoma of the Lung As Documented below.  Up until the End of March 2022 in Early April 2022 His Functional Status ECOG Was 2 and He Decided Full Medical Care and Code.  It Appears after His Visit at [facility] He Was Given Moxifloxacin for an Undetermined "Lung Infection".  He Took This for 5 Days and by April 25, 2020 He Became Bedbound with Confusion and Dizziness.  Started Becoming Hypoxemic [New Hypoxemia] Requiring 2 L Oxygen -3 L oxygen .  After that he was placed on some  Antidizziness medication which he took till May 03, 2020.  This was then stopped because of lack of improvement.  Subsequently started improving.  His shortness of breath started getting better.  His functional status started getting better.  His appetite started getting better.  Then abruptly on the day of admission 05/14/2020 he started having chills/rigors [although no fever at home and since then] was found to be profoundly hypoxemic and brought into the emergency department where he was in respiratory distress and placed on BiPAP.  Was transferred to the ICU bed 1229.  Critical care medicine consulted.  At the time of critical care evaluation he was breathing 20 times a minute slightly paradoxical but speaking full sentences alert and calm.  Family indicated that he is actually significantly better.  CT angiogram chest visualized.  Shows old pleural effusion.  Shows emphysema.  Shows significant pulm infiltrates [pneumonia versus lung cancer].  Pulmonary embolism has been ruled out.  Patient is full code.  Daughter and son  indicate openness to more detailed goals of care discussion after getting clarity on true prognosis from [facility] oncology.  Dr. Shanon Brow of [facility] hospitalist has attempted to reach [facility] transfer center but the line was busy.  [name] is being empirically treated with antibiotics Labs also indicate acute kidney injury: Baseline creatinine 1.0 mg percent March 2022 at [facility] History   Lung cancer hx at [facility] - last visti 04/21/20  diagnosis of Stage IV lung adenocarcinoma with Right malignant pleural effusion.   ONCOLOGIC PROBLEM LIST:   1) metastatic adenocarcinoma wihtout mutation in [facility] testing  - 08/14/17 presented w 4-d cough and SOB, CXR Right lung base consolidation consistent with pneumonia in the proper clinical setting. - 08/15/17 CTA negative for pulmonary embolism, Moderate layering right pleural effusion. Mild associated atelectasis. Negative for pneumonia. Small pulmonary nodules measuring up to 5 mm. Emphysema with biapical pleural based scarring. small calcification along the right diaphragm with mild progression from 2014. No diffuse or contralateral pleural plaque.  - 08/15/17 CT Abdomen negative for acute findings. Chronic left renal staghorn calculus without obstructive uropathy. Sigmoid diverticulosis without active inflammation. Progressed lumbar spine degeneration since 2014, but no acute osseous abnormality identified. - 08/19/17 ECHO : LVEF 60-65%, LV and RV wall motion was normal. RV cavity size and wall thickness were also normal. - 08/21/17 R thoracentesis removed 1,750 ml fluid. Cytology pos adenocarcinoma. - 09/05/17 right pleurx placed, removed 700 ml effusion. - 09/12/17 pleurodesis.  - 09/13/17 MRI brain  NED metastatic disease.  - 09/24/17 CT chest bilat lung nodules up to 1.2 cm. NED metastases a/p. [place] LE negative DVT.  - 10/01/17 start Cycle one carbo 4/ pemetrexed 400 / pembro.  - 11/24/17 CT chest multiple bilat lung nodules and right  pleural effusion decreased. - 01/19/18 transitioned to maintenance pem/pembro. - 02/11/18 CT chest stable w new GGO inflammatory.  - 03/02/18 pos influenza A, Tamiflu x 7 days.  - 2/29- 03/15/18 admission for hypoxia/ PNA. CT angio no PE, increased consolidative opacities in bilat lung concerning drug toxicity and infection, treated w ABX and pred 40 mg q 3 day taper. - 05/18/18 CT chest increase small RUL subpleural nodule. Decrease mediastinal and bilat hilar LAD. Small pleural effusion R. - 08/19/18 CT chest increase RUL subpleural mass.  - 09/02/18 PET avid RUL subpleural mass, right perivertebral pleura at T5 and T10. More diffuse pleural hypermetabolism in right lateral costophrenic sulcus.  - 09/15/18 resumed pembrolizumab.  - 11/23/18 CT chest increase in right pleural mass.  - 12/1 - 01/01/19 SBRT 48 Gy to right lung (medial), SBRT 48 Gy to right lung (lateral).  - 01/07/19 MRI brain There is a punctate acute right cerebellar infarct. There is no evidence for metastatic disease. unchanged marked prominence of the CSF within the bilateral optic nerve sheaths. - 01/10/19 resumed pembro.  - 04/12/19 CT chest decrease in right pleural masses.  - 07/05/19 CT chest stable to decrease in right peripheral irregular opacity. - 8/1/20221 - thoracentesis on 08/22/19 yielding positive cytology for adenocarcinoma  - 10/01/19 CT cap new L4, bilat iliac bone (up to 2.8 cm) and right inferior pubic ramus bone lesions concerning new osseous mets.  - 10/19/19 bone scan increased radiotracer activity in right iliac bone and L4 vertebra concerning osseous metastatic disease.  - 10/20-11/2/21 RT L3-L4 / pelvis 30 Gy. - 12/10/19 resume pembro. - 01/28/20 CT cap increase in R lung peripheral consolidation extending to R hilum, increase in R hilar LAD, new left adrenal lesion concerning metastasis. - 02/25/20 started gem 1000 mg/m2 q 2 wks.  - 04/04/20 pt reported mid and lower back pain w leg weakness. MRI total spine  Indeterminate marrow signal abnormality in the T9 vertebral body which may represent osseous metastatic disease versus atypical vertebral body hemangioma. Progression of soft tissue metastatic disease in the right T11-T12 intercostal space and osseous metastatic disease noted in the posterior aspect of the right fifth and sixth ribs. Left adrenal lesion and signal abnormality throughout the right lung, consistent with primary lung neoplasm and adrenal metastatic disease. At L2-L3 and L3-L4, there is severe degenerative spinal canal narrowing - 04/18/20 fever and DOE, CT angio no PE, increase in consolidative process in right midlung w extension to the hilum. Increase in mediastinal LNs.     = 04/21/20 ECOG Per [place]: ECOG Performance Status: Grade 2 : Ambulatory and capable of all selfcare but unable to carry out any work activities. Up and about more than 50% of waking hours   - 4!522 per daughter "  Dad is getting in his wheelchair with help and grooming himself in the morning. He is eating and drinking his water. I feel he needs light physical therapy also  Plan April 2022 visit  - Cancer pain  - Rx opioids and DJD L spone - CAncer: RTC 2 week for Goals discussion and chemo if stable Perf status     has a past medical history of Arthritis, Asthma, Blood in urine, Cancer ([place]), Chronic kidney disease,  Constipation, Cough, Difficulty urinating, Easy bruising, Hearing loss, Hyperparathyroidism, Hypertension, Nasal congestion, Trouble swallowing, and Wheezing.   reports that he quit smoking about 26 years ago. His smoking use included cigarettes. He quit after 50.00 years of use. He has never used smokeless tobacco.  Past Surgical History:  Procedure Laterality Date  . CYSTOSCOPY W/ URETERAL STENT PLACEMENT  2009   with stone removal  . LITHOTRIPSY  2010, 2012    two procedures performed  . tumor on thumb  30 yr ago    Allergies  Allergen Reactions  . Diphenhydramine Hives, Itching and Nausea  Only    All over the body   . Penicillins Hives, Itching and Nausea Only    All over the body  . Septra [Sulfamethoxazole W/Trimethoprim (Co-Trimoxazole)] Hives, Itching and Nausea Only    All over the body  . Moxifloxacin Hcl     Dizziness, weakness     There is no immunization history on file for this patient.  Family History  Problem Relation Age of Onset  . Cancer Sister      Current Facility-Administered Medications:  .  0.9 %  sodium chloride infusion, 250 mL, Intravenous, PRN, Derrill Kay A, MD .  albuterol (PROVENTIL) (2.5 MG/3ML) 0.083% nebulizer solution 2.5 mg, 2.5 mg, Nebulization, Q2H PRN, Derrill Kay A, MD .  aspirin EC tablet 81 mg, 81 mg, Oral, Daily, Shanon Brow, Rachal A, MD .  chlorhexidine (PERIDEX) 0.12 % solution 15 mL, 15 mL, Mouth Rinse, BID, Derrill Kay A, MD, 15 mL at 05/14/20 1311 .  Chlorhexidine Gluconate Cloth 2 % PADS 6 each, 6 each, Topical, Daily, Phillips Grout, MD, 6 each at 05/14/20 1329 .  diltiazem (DILACOR XR) 24 hr capsule 180 mg, 180 mg, Oral, Daily, Shanon Brow, Rachal A, MD .  enoxaparin (LOVENOX) injection 40 mg, 40 mg, Subcutaneous, Q24H, Derrill Kay A, MD, 40 mg at 05/14/20 1311 .  hydrochlorothiazide (MICROZIDE) capsule 12.5 mg, 12.5 mg, Oral, Daily, Derrill Kay A, MD .  levothyroxine (SYNTHROID) tablet 75 mcg, 75 mcg, Oral, QAC breakfast, Derrill Kay A, MD .  MEDLINE mouth rinse, 15 mL, Mouth Rinse, q12n4p, Derrill Kay A, MD .  sodium chloride flush (NS) 0.9 % injection 3 mL, 3 mL, Intravenous, Q12H, Derrill Kay A, MD, 3 mL at 05/14/20 1320 .  sodium chloride flush (NS) 0.9 % injection 3 mL, 3 mL, Intravenous, PRN, Phillips Grout, MD .  Derrill Memo ON 05/15/2020] vancomycin (VANCOCIN) IVPB 1000 mg/200 mL premix, 1,000 mg, Intravenous, Q24H, Pham, Anh P, RPH   Significant Hospital Events: Including procedures, antibiotic start and stop dates in addition to other pertinent events   .   Interim History / Subjective:  05/14/2020:  Admit.  Bed [place].  Treatment BiPAP antibiotics  Objective   Blood pressure (!) 130/59, pulse 79, temperature 98.8 F (37.1 C), temperature source Axillary, resp. rate (!) 29, height 5\' 8"  (1.727 m), weight 84.1 kg, SpO2 94 %.    FiO2 (%):  [35 %] 35 %   Intake/Output Summary (Last 24 hours) at 05/14/2020 1537 Last data filed at 05/14/2020 1300 Gross per 24 hour  Intake 343.81 ml  Output 100 ml  Net 243.81 ml   Filed Weights   05/14/20 1127  Weight: 84.1 kg    Examination: General: Elderly male sitting on the bed with BiPAP on HENT: BiPAP on.  No elevated JVP no neck nodes. Lungs: Clear to auscultation bilaterally diminished lower.  No wheezing.  Respirate 28.  Not  much accessory muscle use but slightly paradoxical Cardiovascular: Tachycardic regular rate and rhythm Abdomen: Obese Extremities: No cyanosis no clubbing no edema Neuro: Alert and oriented x3 GU: Not examined  Labs/imaging that I havepersonally reviewed  (right click and "Reselect all SmartList Selections" daily)  Reviewed  Resolved Hospital Problem list   X  Assessment & Plan:  Baseline: Metastatic stage IV lung cancer and 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 Admit/current: Acute hypoxemic and hypercapnic respiratory failure [currently stable levels might be baseline]    -severe    -Differential diagnosis includes: Progressive malignancy versus HCAP versus viral illnesses  Plan - BiPAP -Intubate if worse [family wants to hold off on intubation at this moment and intubate only when he is rapidly deteriorating because they believe he has improved since admission.  They have been informed that patient unlikely to survive and intubation] -Bronchodilators -IV steroids for empiric COPD exacerbation  Likely HCAP-rule out sepsis  Plan - Check procalcitonin - Check pan respiratory virus panel - Check urine strep and Legionella - Empiric antibiotics per hospitalist [avoid quinolones given previous  confusion]  - vanc 4/24 - dc based on resnal function and if MRSA PCR ok  - levoflox 4/24 - 4/24 (advisd alternate agent)  Acute kidney injury-probably due to dehydration [has a history of stones]  Plan - Hydration and reassess -Renal ultrasound depending on course  Elevated troponin  Plan - Check cardiac echocardiogram - Telemetry monitoring   Anemia of chronic disease - hgb 11-13gm% in march 2022 at [facility]  05/14/2020 - no bleed  Plan  -- PRBC for hgb </= 6.9gm%    - exceptions are   -  if ACS susepcted/confirmed then transfuse for hgb </= 8.0gm%,  or    -  active bleeding with hemodynamic instability, then transfuse regardless of hemoglobin value   At at all times try to transfuse 1 unit prbc as possible with exception of active hemorrhage     Best practice (right click and "Reselect all SmartList Selections" daily)  Diet:  NPO Pain/Anxiety/Delirium protocol (if indicated): Yes (RASS goal 0) - chronic opiods. Pain Rx by [facility] VAP protocol (if indicated): Not indicated DVT prophylaxis: LMWH GI prophylaxis: PPI Glucose control:  SSI Yes Central venous access:  N/A Arterial line:  N/A Foley:  N/A Mobility:  bed rest  PT consulted: N/A Last date of multidisciplinary goals of care discussion [done -discussed at bedside with respiratory therapy, patient, son and daughter -> 05/14/2020: Patient will not survive CPR or intubation.  They want confirmation of poor prognosis of the lung cancer from [facility] oncology.  Hospitalist reaching out to [facility] oncology] Code Status:  full code Disposition: ICU        ATTESTATION & SIGNATURE   The patient Alex Cherry is critically ill with multiple organ systems failure and requires high complexity decision making for assessment and support, frequent evaluation and titration of therapies, application of advanced monitoring technologies and extensive interpretation of multiple databases.   Critical Care Time devoted to patient care  services described in this note is  75  Minutes. This time reflects time of care of this signee Dr Brand Males. This critical care time does not reflect procedure time, or teaching time or supervisory time of PA/NP/Med student/Med Resident etc but could involve care discussion time     Dr. Brand Males, M.D., [facility].C.P Pulmonary and Critical Care Medicine Staff Physician [place] [facility] Pager: [phone], If no answer or between  15:00h -  7:00h: call [phone]  05/14/2020 3:37 PM    LABS    PULMONARY Recent Labs  Lab 05/14/20 0849 05/14/20 1520  PHART 7.393 7.399  PCO2ART 58.2* 58.4*  PO2ART 182* 72.3*  HCO3 34.7* 35.4*  O2SAT 99.6 94.7    CBC Recent Labs  Lab 05/14/20 0850 05/14/20 1142  HGB 11.7* 10.8*  HCT 39.3 35.3*  WBC 18.6* 29.7*  PLT 225 205    COAGULATION Recent Labs  Lab 05/14/20 0850  INR 1.1    CARDIAC  No results for input(s): TROPONINI in the last 168 hours. No results for input(s): PROBNP in the last 168 hours.   CHEMISTRY Recent Labs  Lab 05/14/20 0850 05/14/20 1142  NA 144  --   K 3.1*  --   CL 102  --   CO2 30  --   GLUCOSE 112*  --   BUN 18  --   CREATININE 1.37* 1.39*  CALCIUM 10.6*  --    Estimated Creatinine Clearance: 41.8 mL/min (A) (by C-G formula based on SCr of 1.39 mg/dL (H)).   LIVER Recent Labs  Lab 05/14/20 0850  AST 23  ALT 20  ALKPHOS 94  BILITOT 0.9  PROT 6.3*  ALBUMIN 2.8*  INR 1.1     INFECTIOUS Recent Labs  Lab 05/14/20 0850 05/14/20 1142  LATICACIDVEN 2.1* 1.4     ENDOCRINE CBG (last 3)  No results for input(s): GLUCAP in the last 72 hours.       IMAGING x48h  - image(s) personally visualized  -   highlighted in bold CT Angio Chest PE W and/or Wo Contrast  Result Date: 05/14/2020 CLINICAL DATA:  PE suspected.  History of lung cancer. EXAM: CT ANGIOGRAPHY CHEST WITH CONTRAST TECHNIQUE: Multidetector CT imaging of the chest  was performed using the standard protocol during bolus administration of intravenous contrast. Multiplanar CT image reconstructions and MIPs were obtained to evaluate the vascular anatomy. CONTRAST:  49mL OMNIPAQUE IOHEXOL 350 MG/ML SOLN COMPARISON:  08/15/2017 FINDINGS: Cardiovascular: Satisfactory opacification of the pulmonary arteries to the segmental level. No evidence of pulmonary embolism. Normal heart size. No pericardial effusion. Aortic atherosclerosis. Mediastinum/Nodes: No discrete thyroid nodule. Retained secretions are identified within the trachea. The esophagus is unremarkable. Prominent mediastinal lymph nodes are noted, none of which meet CT criteria for adenopathy. No hilar adenopathy. Lungs/Pleura: Small bilateral pleural effusions are identified, left greater right. Moderate to advanced changes of paraseptal and centrilobular emphysema with diffuse bronchial wall thickening. Bilateral multifocal geographic areas of airspace consolidation identified along with diffuse ground-glass attenuation. In the acute setting findings are likely secondary to multifocal pneumonia. Underlying recurrent tumor in this patient who has a history of stage IV lung cancer would be difficult to exclude. Upper Abdomen: No acute abnormality. Left upper pole kidney stones measure up to 1.0 cm. There is a new left adrenal nodule measuring 1.7 x 3.7 cm, image 293/5. Posterior right hepatic lobe hypodensity measures 1.4 cm and is not significantly changed from previous exam. Musculoskeletal: Spondylosis noted throughout the thoracic spine. No acute or suspicious osseous findings. Review of the MIP images confirms the above findings. IMPRESSION: 1. No evidence for acute pulmonary embolus. 2. Bilateral multifocal geographic areas of airspace consolidation and ground-glass attenuation are identified. In the acute setting findings are likely secondary to multifocal pneumonia. Underlying recurrent tumor in this patient who has a  history of stage IV lung cancer would be difficult to exclude. Follow-up imaging is recommended to ensure resolution. In  the absence of resolution tissue sampling and or PET CT may be helpful for further evaluation 3. New left adrenal nodule worrisome for metastatic disease. 4. Diffuse bronchial wall thickening with emphysema, as above; imaging findings suggestive of underlying COPD. 5. Emphysema and aortic atherosclerosis. Aortic Atherosclerosis (ICD10-I70.0) and Emphysema (ICD10-J43.9). Electronically Signed   By: Kerby Moors M.D.   On: 05/14/2020 12:38   DG Chest Port 1 View  Result Date: 05/14/2020 CLINICAL DATA:  Pt on 2.5-3L O2 continuous but has to be increased when pt gets up. Pt has lung cancer. Pt comes in on NRB. Pt was treated for lung infection 3 weeks ago but had to be taken off medication due to AMS and increased dizziness. EXAM: PORTABLE CHEST - 1 VIEW COMPARISON:  08/21/2017 FINDINGS: Interval development of extensive airspace opacities throughout both lungs, with relative sparing in the right upper lobe, and more focal consolidation in the right mid lung. There is blunting of the left lateral costophrenic angle suggesting small effusion. Heart size remains normal. No pneumothorax. Vertebral endplate spurring at multiple levels in the mid thoracic spine. IMPRESSION: Interval development of extensive bilateral airspace infiltrates or edema. Electronically Signed   By: Lucrezia Europe M.D.   On: 05/14/2020 08:52

## 2020-05-14 NOTE — Progress Notes (Signed)
A consult was received from an ED physician for vancomycin and levaquin per pharmacy dosing.  The patient's profile has been reviewed for ht/wt/allergies/indication/available labs.   A one time order has been placed for vancomycin 1g and levaquin 750mg .  Further antibiotics/pharmacy consults should be ordered by admitting physician if indicated.                       Thank you, Peggyann Juba, PharmD, BCPS 05/14/2020  8:48 AM

## 2020-05-14 NOTE — ED Notes (Signed)
hospitalist at bedside

## 2020-05-14 NOTE — Progress Notes (Signed)
Pharmacy Antibiotic Note  Alex Cherry is a 87 y.o. male with hx metastatic lung cancer and pleural effusion presented to the ED on 05/14/2020 with c/o SOB.  Pharmacy has been consulted to dose vancomycin for sepsis.  Plan: - vancomcycin 1000 mg IV x1 given in the ED at 0900. Will give an additional 750mg  IV x1 to get 1750mg  total for today, then 1000mg  IV q24h for est AUC 451 - adjust levaquin to 750mg  IV q48h for crcl 20-49 - monitor renal function closely  _________________________________________  Temp (24hrs), Avg:99 F (37.2 C), Min:99 F (37.2 C), Max:99 F (37.2 C)  Recent Labs  Lab 05/14/20 0850  WBC 18.6*  CREATININE 1.37*  LATICACIDVEN 2.1*    CrCl cannot be calculated (Unknown ideal weight.).    Allergies  Allergen Reactions  . Diphenhydramine Hives, Itching and Nausea Only    All over the body   . Penicillins Hives, Itching and Nausea Only    All over the body  . Septra [Sulfamethoxazole W/Trimethoprim (Co-Trimoxazole)] Hives, Itching and Nausea Only    All over the body  . Moxifloxacin Hcl     Dizziness, weakness     Thank you for allowing pharmacy to be a part of this patient's care.  Lynelle Doctor 05/14/2020 11:06 AM

## 2020-05-14 NOTE — H&P (Addendum)
History and Physical    Alex Cherry NAT:[id number] DOB: 05/01/1933 DOA: 05/14/2020  PCP: Leonard Downing, MD  Patient coming from: Home  Chief Complaint: Shortness of breath  HPI: Alex Cherry is a 87 y.o. male with medical history significant of stage IV lung cancer being treated at [facility] who has missed his several sessions lately of chemotherapy because he is been declining.  His [facility] oncologist spoke to him and his family about hospice on April 1.  Since April 1 he has declined with worsening shortness of breath and D satting with any movement.  He is chronically on 2-1/2 to 5 L nasal cannula normally.  He was recently treated for pneumonia with 5 days of Avelox.  He has not run any fevers at home.  Not had any nausea vomiting or diarrhea.  Patient comes in with acute shortness of breath found to be hypoxic 70% on 5 L currently on BiPAP.  Chest x-ray shows diffuse opacities.  White count is 18.  He is widowed with 1 daughter and 1 son.  Daughter is at the bedside.  He is full code still.  Patient being referred for admission for acute hypoxic respiratory failure.   Review of Systems: As per HPI otherwise 10 point review of systems negative.   Past Medical History:  Diagnosis Date  . Arthritis   . Asthma    as a child  . Blood in urine   . Chronic kidney disease    rt ureteral calculus  . Constipation   . Cough   . Difficulty urinating   . Easy bruising   . Hearing loss   . Hyperparathyroidism   . Hypertension    Denies any other heart  conditions, no chest pain or arrtthy.  . Nasal congestion   . Trouble swallowing   . Wheezing     Past Surgical History:  Procedure Laterality Date  . CYSTOSCOPY W/ URETERAL STENT PLACEMENT  2009   with stone removal  . LITHOTRIPSY  2010, 2012    two procedures performed  . tumor on thumb  30 yr ago     reports that he quit smoking about 26 years ago. His smoking use included cigarettes. He quit after 50.00 years of use. He  has never used smokeless tobacco. He reports current alcohol use. He reports that he does not use drugs.  Allergies  Allergen Reactions  . Diphenhydramine Hives, Itching and Nausea Only    All over the body   . Penicillins Hives, Itching and Nausea Only    All over the body  . Septra [Sulfamethoxazole W/Trimethoprim (Co-Trimoxazole)] Hives, Itching and Nausea Only    All over the body  . Moxifloxacin Hcl     Dizziness, weakness    Family History  Problem Relation Age of Onset  . Cancer Sister     Prior to Admission medications   Medication Sig Start Date End Date Taking? Authorizing Provider  albuterol (PROVENTIL HFA;VENTOLIN HFA) 108 (90 Base) MCG/ACT inhaler Inhale 2 puffs into the lungs every 6 (six) hours as needed for wheezing or shortness of breath.   Yes [provider]  aspirin EC 81 MG tablet Take 81 mg by mouth daily. Swallow whole.   Yes [provider]  Dextromethorphan-guaiFENesin (ROBAFEN DM PO) Take 20 mLs by mouth every 4 (four) hours.   Yes [provider]  hydrochlorothiazide (MICROZIDE) 12.5 MG capsule Take 12.5 mg by mouth daily. 12/22/19  Yes [provider]  levothyroxine (SYNTHROID,  LEVOTHROID) 75 MCG tablet Take 75 mcg by mouth daily before breakfast.   Yes [provider]  Vitamin D, Ergocalciferol, (DRISDOL) 1.25 MG (50000 UNIT) CAPS capsule Take 50,000 Units by mouth every 7 (seven) days.   Yes [provider]  diltiazem (DILACOR XR) 180 MG 24 hr capsule Take 180 mg by mouth daily.    [provider]    Physical Exam: Vitals:   05/14/20 0915 05/14/20 0930 05/14/20 1000 05/14/20 1015  BP: 137/62 137/64 (!) 142/68 127/69  Pulse: (!) 104 100 (!) 106 (!) 105  Resp: (!) 35 (!) 27 (!) 25 19  Temp:      TempSrc:      SpO2: 91% 93% 95% 93%      Constitutional: NAD, calm, comfortable on BiPAP but still tachypneic currently mentating normal Vitals:   05/14/20 0915 05/14/20 0930 05/14/20 1000  05/14/20 1015  BP: 137/62 137/64 (!) 142/68 127/69  Pulse: (!) 104 100 (!) 106 (!) 105  Resp: (!) 35 (!) 27 (!) 25 19  Temp:      TempSrc:      SpO2: 91% 93% 95% 93%   Eyes: PERRL, lids and conjunctivae normal ENMT: Mucous membranes are moist. Posterior pharynx clear of any exudate or lesions.Normal dentition.  Neck: normal, supple, no masses, no thyromegaly Respiratory: clear to auscultation bilaterally, no wheezing, no crackles. Normal respiratory effort. No accessory muscle use.  Cardiovascular: Regular rate and rhythm, no murmurs / rubs / gallops. No extremity edema. 2+ pedal pulses. No carotid bruits.  Abdomen: no tenderness, no masses palpated. No hepatosplenomegaly. Bowel sounds positive.  Musculoskeletal: no clubbing / cyanosis. No joint deformity upper and lower extremities. Good ROM, no contractures. Normal muscle tone.  Skin: no rashes, lesions, ulcers. No induration Neurologic: CN 2-12 grossly intact. Sensation intact, DTR normal. Strength 5/5 in all 4.  Psychiatric: Normal judgment and insight. Alert and oriented x 3. Normal mood.    Labs on Admission: I have personally reviewed following labs and imaging studies  CBC: Recent Labs  Lab 05/14/20 0850  WBC 18.6*  NEUTROABS 17.9*  HGB 11.7*  HCT 39.3  MCV 107.7*  PLT 854   Basic Metabolic Panel: Recent Labs  Lab 05/14/20 0850  NA 144  K 3.1*  CL 102  CO2 30  GLUCOSE 112*  BUN 18  CREATININE 1.37*  CALCIUM 10.6*   GFR: CrCl cannot be calculated (Unknown ideal weight.). Liver Function Tests: Recent Labs  Lab 05/14/20 0850  AST 23  ALT 20  ALKPHOS 94  BILITOT 0.9  PROT 6.3*  ALBUMIN 2.8*   No results for input(s): LIPASE, AMYLASE in the last 168 hours. No results for input(s): AMMONIA in the last 168 hours. Coagulation Profile: Recent Labs  Lab 05/14/20 0850  INR 1.1   Cardiac Enzymes: No results for input(s): CKTOTAL, CKMB, CKMBINDEX, TROPONINI in the last 168 hours. BNP (last 3  results) No results for input(s): PROBNP in the last 8760 hours. HbA1C: No results for input(s): HGBA1C in the last 72 hours. CBG: No results for input(s): GLUCAP in the last 168 hours. Lipid Profile: No results for input(s): CHOL, HDL, LDLCALC, TRIG, CHOLHDL, LDLDIRECT in the last 72 hours. Thyroid Function Tests: No results for input(s): TSH, T4TOTAL, FREET4, T3FREE, THYROIDAB in the last 72 hours. Anemia Panel: No results for input(s): VITAMINB12, FOLATE, FERRITIN, TIBC, IRON, RETICCTPCT in the last 72 hours. Urine analysis:    Component Value Date/Time   COLORURINE YELLOW 05/14/2020 0823   APPEARANCEUR HAZY (  A) 05/14/2020 0823   LABSPEC 1.011 05/14/2020 0823   PHURINE 7.0 05/14/2020 0823   GLUCOSEU NEGATIVE 05/14/2020 0823   HGBUR NEGATIVE 05/14/2020 0823   BILIRUBINUR NEGATIVE 05/14/2020 0823   KETONESUR NEGATIVE 05/14/2020 0823   PROTEINUR 30 (A) 05/14/2020 0823   NITRITE NEGATIVE 05/14/2020 0823   LEUKOCYTESUR LARGE (A) 05/14/2020 0823   Sepsis Labs: !!!!!!!!!!!!!!!!!!!!!!!!!!!!!!!!!!!!!!!!!!!! @LABRCNTIP (procalcitonin:4,lacticidven:4) ) Recent Results (from the past 240 hour(s))  Resp Panel by RT-PCR (Flu A&B, Covid) Nasopharyngeal Swab     Status: None   Collection Time: 05/14/20  8:50 AM   Specimen: Nasopharyngeal Swab; Nasopharyngeal(NP) swabs in vial transport medium  Result Value Ref Range Status   SARS Coronavirus 2 by RT PCR NEGATIVE NEGATIVE Final    Comment: (NOTE) SARS-CoV-2 target nucleic acids are NOT DETECTED.  The SARS-CoV-2 RNA is generally detectable in upper respiratory specimens during the acute phase of infection. The lowest concentration of SARS-CoV-2 viral copies this assay can detect is 138 copies/mL. A negative result does not preclude SARS-Cov-2 infection and should not be used as the sole basis for treatment or other patient management decisions. A negative result may occur with  improper specimen collection/handling, submission of  specimen other than nasopharyngeal swab, presence of viral mutation(s) within the areas targeted by this assay, and inadequate number of viral copies(<138 copies/mL). A negative result must be combined with clinical observations, patient history, and epidemiological information. The expected result is Negative.  Fact Sheet for Patients:  EntrepreneurPulse.com.au  Fact Sheet for Healthcare Providers:  IncredibleEmployment.be  This test is no t yet approved or cleared by the [place] FDA and  has been authorized for detection and/or diagnosis of SARS-CoV-2 by FDA under an Emergency Use Authorization (EUA). This EUA will remain  in effect (meaning this test can be used) for the duration of the COVID-19 declaration under Section 564(b)(1) of the Act, 21 U.S.C.section 360bbb-3(b)(1), unless the authorization is terminated  or revoked sooner.       Influenza A by PCR NEGATIVE NEGATIVE Final   Influenza B by PCR NEGATIVE NEGATIVE Final    Comment: (NOTE) The Xpert Xpress SARS-CoV-2/FLU/RSV plus assay is intended as an aid in the diagnosis of influenza from Nasopharyngeal swab specimens and should not be used as a sole basis for treatment. Nasal washings and aspirates are unacceptable for Xpert Xpress SARS-CoV-2/FLU/RSV testing.  Fact Sheet for Patients: EntrepreneurPulse.com.au  Fact Sheet for Healthcare Providers: IncredibleEmployment.be  This test is not yet approved or cleared by the [place] FDA and has been authorized for detection and/or diagnosis of SARS-CoV-2 by FDA under an Emergency Use Authorization (EUA). This EUA will remain in effect (meaning this test can be used) for the duration of the COVID-19 declaration under Section 564(b)(1) of the Act, 21 U.S.C. section 360bbb-3(b)(1), unless the authorization is terminated or revoked.  Performed at [facility], [place]  [address], [place], [place] [address]      Radiological Exams on Admission: DG Chest Port 1 View  Result Date: 05/14/2020 CLINICAL DATA:  Pt on 2.5-3L O2 continuous but has to be increased when pt gets up. Pt has lung cancer. Pt comes in on NRB. Pt was treated for lung infection 3 weeks ago but had to be taken off medication due to AMS and increased dizziness. EXAM: PORTABLE CHEST - 1 VIEW COMPARISON:  08/21/2017 FINDINGS: Interval development of extensive airspace opacities throughout both lungs, with relative sparing in the right upper lobe, and more focal consolidation in the right mid  lung. There is blunting of the left lateral costophrenic angle suggesting small effusion. Heart size remains normal. No pneumothorax. Vertebral endplate spurring at multiple levels in the mid thoracic spine. IMPRESSION: Interval development of extensive bilateral airspace infiltrates or edema. Electronically Signed   By: Lucrezia Europe M.D.   On: 05/14/2020 08:52    EKG: Independently reviewed.  Sinus tachycardia no ischemic changes Old chart reviewed Case discussed with EDP X-ray reviewed bilateral opacities   Assessment/Plan  87 year old male with advanced stages of lung cancer with continued respiratory decline over the last month.  Comes in with acute on chronic respiratory failure with hypoxia and hypercapnia  Principal Problem:    Acute on chronic respiratory failure with hypoxia and hypercapnia (HCC)-sepsis secondary to pneumonia in the setting of advanced lung cancer.  Place patient on vancomycin and Avelox.  Continue BiPAP.  Patient is full code.  Placed on frequent bronchodilators.  Admit to stepdown.  Active Problems:    Stage 4 lung cancer (HCC)-noted    HCAP (healthcare-associated pneumonia)-vancomycin and Avelox as above.  Follow-up on blood cultures.    Sepsis (HCC)-antibiotics as above  I had a long discussion with the daughter and patient at the bedside with respiratory therapy in the  room.  I went over his overall very poor prognosis if he were to need to be intubated and he would likely not come off of life support.  They report they have not had this discussion with their oncology team however they were referred to hospice April 1 by their oncologist at which time nothing has really been set up for that.  I have discussed with them his overall poor prognosis.  They wish to continue with full CODE STATUS and he if he continues to decline and failed BiPAP then reassess whether or not they want him intubated at that time.  I have discussed with them this will likely happen within the next 10 hours sooner rather than later.    Further recommendations pending on overall hospital course   DVT prophylaxis: Lovenox Code Status: Full Family Communication: Daughter Disposition Plan: Days Consults called: None Admission status: Admission   Ike Maragh A MD [facility]  If 7PM-7AM, please contact night-coverage [email or web address] Password [email or web address]  05/14/2020, 10:47 AM   Per Dr. Chase Caller family wants to confirm with [facility] oncology that his overall prognosis is poor.  I have attempted to try to call oncology transfer line and it is not working at this time.  They are still wanting him full code.  Attempts will be made at a later time.

## 2020-05-14 NOTE — ED Notes (Signed)
Pt. Documented in error see above note in chart. 

## 2020-05-14 NOTE — ED Notes (Signed)
Daughter at bedside went outside to be with other family.  Can call her son, Alex Cherry, [phone]. That can call to reach her if need anything.

## 2020-05-14 NOTE — ED Notes (Signed)
Stuck patient twice for second IV access. Unable to get blood needed for specimens.

## 2020-05-14 NOTE — ED Triage Notes (Signed)
Pt on 2.5-3L O2 continuous but has to be increased when pt gets up. Pt has lung cancer. Pt comes in on NRB. Was treated for lung infection 3 weeks ago but had to be taken off medication due to AMS and increased dizziness. Pt still having productive cough.  140s HR, 55% on 3L.

## 2020-05-15 ENCOUNTER — Inpatient Hospital Stay (HOSPITAL_COMMUNITY): Payer: Medicare Other

## 2020-05-15 DIAGNOSIS — R6520 Severe sepsis without septic shock: Secondary | ICD-10-CM

## 2020-05-15 DIAGNOSIS — Z515 Encounter for palliative care: Secondary | ICD-10-CM

## 2020-05-15 DIAGNOSIS — J159 Unspecified bacterial pneumonia: Secondary | ICD-10-CM

## 2020-05-15 DIAGNOSIS — R778 Other specified abnormalities of plasma proteins: Secondary | ICD-10-CM

## 2020-05-15 DIAGNOSIS — R0603 Acute respiratory distress: Secondary | ICD-10-CM

## 2020-05-15 DIAGNOSIS — B952 Enterococcus as the cause of diseases classified elsewhere: Secondary | ICD-10-CM

## 2020-05-15 DIAGNOSIS — C3491 Malignant neoplasm of unspecified part of right bronchus or lung: Secondary | ICD-10-CM

## 2020-05-15 DIAGNOSIS — R7881 Bacteremia: Secondary | ICD-10-CM

## 2020-05-15 DIAGNOSIS — Z880 Allergy status to penicillin: Secondary | ICD-10-CM

## 2020-05-15 DIAGNOSIS — A419 Sepsis, unspecified organism: Secondary | ICD-10-CM

## 2020-05-15 LAB — ECHOCARDIOGRAM COMPLETE
Area-P 1/2: 3.31 cm2
Calc EF: 67.8 %
Height: 68 in
S' Lateral: 1.8 cm
Single Plane A2C EF: 71.4 %
Single Plane A4C EF: 61 %
Weight: 2952.4 oz

## 2020-05-15 LAB — BLOOD CULTURE ID PANEL (REFLEXED) - BCID2

## 2020-05-15 LAB — CBC
HCT: 33.5 % — ABNORMAL LOW (ref 39.0–52.0)
Hemoglobin: 10 g/dL — ABNORMAL LOW (ref 13.0–17.0)
MCH: 32.6 pg (ref 26.0–34.0)
MCHC: 29.9 g/dL — ABNORMAL LOW (ref 30.0–36.0)
MCV: 109.1 fL — ABNORMAL HIGH (ref 80.0–100.0)
Platelets: 175 10*3/uL (ref 150–400)
RBC: 3.07 MIL/uL — ABNORMAL LOW (ref 4.22–5.81)
RDW: 17.8 % — ABNORMAL HIGH (ref 11.5–15.5)
WBC: 21.3 10*3/uL — ABNORMAL HIGH (ref 4.0–10.5)
nRBC: 0 % (ref 0.0–0.2)

## 2020-05-15 LAB — COMPREHENSIVE METABOLIC PANEL
ALT: 18 U/L (ref 0–44)
AST: 20 U/L (ref 15–41)
Albumin: 2.4 g/dL — ABNORMAL LOW (ref 3.5–5.0)
Alkaline Phosphatase: 81 U/L (ref 38–126)
Anion gap: 8 (ref 5–15)
BUN: 23 mg/dL (ref 8–23)
CO2: 31 mmol/L (ref 22–32)
Calcium: 10.3 mg/dL (ref 8.9–10.3)
Chloride: 105 mmol/L (ref 98–111)
Creatinine, Ser: 1.33 mg/dL — ABNORMAL HIGH (ref 0.61–1.24)
GFR, Estimated: 53 mL/min — ABNORMAL LOW (ref >60)
Glucose, Bld: 168 mg/dL — ABNORMAL HIGH (ref 70–99)
Potassium: 3.8 mmol/L (ref 3.5–5.1)
Sodium: 144 mmol/L (ref 135–145)
Total Bilirubin: 0.9 mg/dL (ref 0.3–1.2)
Total Protein: 5.8 g/dL — ABNORMAL LOW (ref 6.5–8.1)

## 2020-05-15 LAB — TROPONIN I (HIGH SENSITIVITY): Troponin I (High Sensitivity): 52 ng/L — ABNORMAL HIGH (ref <18)

## 2020-05-15 LAB — LACTIC ACID, PLASMA: Lactic Acid, Venous: 1 mmol/L (ref 0.5–1.9)

## 2020-05-15 LAB — EXPECTORATED SPUTUM ASSESSMENT W GRAM STAIN, RFLX TO RESP C

## 2020-05-15 LAB — PROCALCITONIN: Procalcitonin: 19.73 ng/mL

## 2020-05-15 MED ORDER — SODIUM CHLORIDE 0.9 % IV SOLN
3.0000 g | Freq: Four times a day (QID) | INTRAVENOUS | Status: DC
  Administered 2020-05-15 – 2020-05-17 (×8): 3 g via INTRAVENOUS
  Filled 2020-05-15 (×3): qty 3
  Filled 2020-05-15: qty 8
  Filled 2020-05-15: qty 3
  Filled 2020-05-15: qty 8
  Filled 2020-05-15: qty 3
  Filled 2020-05-15 (×2): qty 8

## 2020-05-15 MED ORDER — DILTIAZEM HCL ER COATED BEADS 180 MG PO CP24
180.0000 mg | ORAL_CAPSULE | Freq: Every day | ORAL | Status: DC
  Administered 2020-05-15 – 2020-05-17 (×3): 180 mg via ORAL
  Filled 2020-05-15 (×3): qty 1

## 2020-05-15 MED ORDER — PERFLUTREN LIPID MICROSPHERE
1.0000 mL | INTRAVENOUS | Status: AC | PRN
  Administered 2020-05-15: 2 mL via INTRAVENOUS
  Filled 2020-05-15: qty 10

## 2020-05-15 MED ORDER — LIP MEDEX EX OINT: TOPICAL_OINTMENT | CUTANEOUS | Status: DC | PRN

## 2020-05-15 NOTE — Progress Notes (Signed)
PROGRESS NOTE    Alex Cherry    Code Status: Full Code  ZOX:[id number] DOB: 1933/03/21 DOA: 05/14/2020 LOS: 1 days  PCP: Leonard Downing, MD CC:  Chief Complaint  Patient presents with  . Dizziness  . Shortness of Breath       Hospital Summary   This is an 87 year old male with a history of stage 4 adenocarcinoma of the lung being treated at [facility], hypertension, hyperparathyroidism, COPD who presented to the ED on 4/24 with worsening shortness of breath over the past week. Patient was seen by his oncologist on 4/1 and had a GOC discussion at that time and was referred to hospice due to declining health, however the patient wished to remain Full Code and has since been on 2 L/min [place] though his requirements have increased over the past week. He was recently treated for pneumonia with Avelox for 5 days without improvement. He was found to be hypoxic 50% on 5 L/min in the ED and placed on BiPap. CXR showed opacities and WBC was 18 on presentation. He remained full code on admission after hospitalist and PCCM had a [place] discussion. He was admitted for severe sepsis suspected secondary to HAP.   4/25: Hospitalist discussion with Tommi Rumps, NP who is his oncology provider who recommended comfort measures and agreed to discuss with the patient's daughter over the phone. Hospitalist had a GOC discussion with the patient, daughter, son and grandson at bedside. Plan was made to change to DNR and pursue hospice. Palliative care was consulted for further assistance.     A & P   Principal Problem:   Acute on chronic respiratory failure with hypoxia and hypercapnia (HCC) Active Problems:   Stage 4 lung cancer ([place])   HCAP (healthcare-associated pneumonia)   Sepsis ([place])   AKI (acute kidney injury) ([place])   Declining functional status   Poor prognosis   Goals of care, counseling/discussion   COPD (chronic obstructive pulmonary disease) (HCC)   Elevated troponin   1. Acute hypoxemic,  hypercapnic respiratory failure secondary to HAP vs. Progression of metastatic adenocarcinoma of the lung in the setting of COPD a. Uses 2 L/min O2 at home over the past month, currently requiring BiPAP b. CTA chest - negative for PE, bilateral multifocal airspace consolidation and ground glass likely secondary to multifocal pneumonia and cannot exclude recurrent tumor. New left adrenal nodule worrisome for metastatic disease. Emphysema c. Antibiotics per ID d. IV steroids for empiric COPD exacerbation per PCCM  2. Severe sepsis without septic shock secondary to Enterococcus bacteremia suspect secondary to UTI and concern for HAP vs. Progression of malignancy a. Sepsis criteria: tachycardia, tachypnea, leukocytosis, lactic acidosis, worsened o2 requirement, positive UA with bacteremia b. Procal 19 c. Per ID: Vanc/Cefepime -> unasyn, repeat cultures d. TTE: EF 65-70% without vegetations noted   3. Elevated Creatinine, not AKI a. Creatinine 1.1 on 04/07/20 in Care Everywhere, 1.33 today b. Plan as above  4. Elevated Troponin, suspect demand ischemia a. Trop 74-> 173-> 52 b. Echo as above  5. Metastatic adenocarcinoma of the lung a. Discussed with Tommi Rumps, NP - [facility] who recommends comfort care/hospice for the patient and is to discuss with the patient's daughter  6. [place] discussion a. Prior notes and discussion with the patient's oncology NP and family outside the room indicate that the patient has wished to remain full code despite multiple [place] discussion in the past  b. We had an extensive discussion today, that included the  patient at bedside, regarding his current condition, prognosis and what Full Code entails vs. DNR and that it did not mean we would stop our treatments. We also discussed hospice as he had a misconception of what hospice truly was. After clarification, the patient agreed to change his status to DNR and pursue hospice at discharge c. Continue current mode of  care d. Palliative care consulted for further assistance  7. Hypertension a. Continue home cardizem   DVT prophylaxis: enoxaparin (LOVENOX) injection 40 mg Start: 05/14/20 1200 SCDs Start: 05/14/20 1057   Family Communication: Patient's family has been updated   Disposition Plan:  Status is: Inpatient  Remains inpatient appropriate because:Hemodynamically unstable, Unsafe d/c plan, IV treatments appropriate due to intensity of illness or inability to take PO and Inpatient level of care appropriate due to severity of illness   Dispo: The patient is from: Home              Anticipated d/c is to: TBD              Patient currently is not medically stable to d/c.   Difficult to place patient No           Pressure injury documentation    None  Consultants  ID PCCM Palliative  Procedures  Bipap  Antibiotics   Anti-infectives (From admission, onward)   Start     Dose/Rate Route Frequency Ordered Stop   05/16/20 0900  levofloxacin (LEVAQUIN) IVPB 750 mg  Status:  Discontinued        750 mg 100 mL/hr over 90 Minutes Intravenous Every 48 hours 05/14/20 1142 05/14/20 1537   05/15/20 1200  vancomycin (VANCOCIN) IVPB 1000 mg/200 mL premix        1,000 mg 200 mL/hr over 60 Minutes Intravenous Every 24 hours 05/14/20 1143     05/14/20 1700  ceFEPIme (MAXIPIME) 2 g in sodium chloride 0.9 % 100 mL IVPB        2 g 200 mL/hr over 30 Minutes Intravenous Every 12 hours 05/14/20 1554     05/14/20 1430  vancomycin (VANCOREADY) IVPB 750 mg/150 mL  Status:  Discontinued        750 mg 150 mL/hr over 60 Minutes Intravenous NOW 05/14/20 1337 05/14/20 1338   05/14/20 1145  vancomycin (VANCOREADY) IVPB 750 mg/150 mL        750 mg 150 mL/hr over 60 Minutes Intravenous NOW 05/14/20 1143 05/14/20 1527   05/14/20 1100  levofloxacin (LEVAQUIN) IVPB 750 mg  Status:  Discontinued        750 mg 100 mL/hr over 90 Minutes Intravenous Every 24 hours 05/14/20 1058 05/14/20 1142   05/14/20  0900  vancomycin (VANCOCIN) IVPB 1000 mg/200 mL premix        1,000 mg 200 mL/hr over 60 Minutes Intravenous  Once 05/14/20 0847 05/14/20 1007   05/14/20 0900  levofloxacin (LEVAQUIN) IVPB 750 mg        750 mg 100 mL/hr over 90 Minutes Intravenous  Once 05/14/20 0847 05/14/20 1035        Subjective   Patient resting comfortably in bed. He denies any complaints and says he's feeling better. We had a long [place] discussion/family meeting, as noted above. He denies chest pain or any other complaints. No overnight events.  Objective   Vitals:   05/15/20 0400 05/15/20 0500 05/15/20 0600 05/15/20 0700  BP: (!) 152/66 (!) 166/57 (!) 173/50 (!) 146/68  Pulse: 72 79 79 80  Resp: 11 (!)  21 16 14   Temp:  97.9 F (36.6 C)    TempSrc:  Oral    SpO2: 93% 94% 94% 93%  Weight:      Height:        Intake/Output Summary (Last 24 hours) at 05/15/2020 0802 Last data filed at 05/15/2020 0700 Gross per 24 hour  Intake 2084.75 ml  Output 750 ml  Net 1334.75 ml   Filed Weights   05/14/20 1127 05/14/20 1300  Weight: 84.1 kg 83.7 kg    Examination:  Physical Exam Vitals and nursing note reviewed.  Constitutional:      Appearance: Normal appearance.  HENT:     Head: Normocephalic and atraumatic.  Eyes:     Conjunctiva/sclera: Conjunctivae normal.  Cardiovascular:     Rate and Rhythm: Normal rate and regular rhythm.  Pulmonary:     Effort: Pulmonary effort is normal.     Breath sounds: Rales present.     Comments: bipap Abdominal:     General: Abdomen is flat.     Palpations: Abdomen is soft.  Musculoskeletal:        General: No swelling or tenderness.     Right lower leg: Edema present.     Left lower leg: Edema present.  Skin:    Coloration: Skin is not jaundiced or pale.  Neurological:     Mental Status: He is alert. Mental status is at baseline.  Psychiatric:        Mood and Affect: Mood normal.        Behavior: Behavior normal.     Data Reviewed: I have personally  reviewed following labs and imaging studies  CBC: Recent Labs  Lab 05/14/20 0850 05/14/20 1142  WBC 18.6* 29.7*  NEUTROABS 17.9*  --   HGB 11.7* 10.8*  HCT 39.3 35.3*  MCV 107.7* 107.0*  PLT 225 242   Basic Metabolic Panel: Recent Labs  Lab 05/14/20 0850 05/14/20 1142  NA 144  --   K 3.1*  --   CL 102  --   CO2 30  --   GLUCOSE 112*  --   BUN 18  --   CREATININE 1.37* 1.39*  CALCIUM 10.6*  --    GFR: Estimated Creatinine Clearance: 41.7 mL/min (A) (by C-G formula based on SCr of 1.39 mg/dL (H)). Liver Function Tests: Recent Labs  Lab 05/14/20 0850  AST 23  ALT 20  ALKPHOS 94  BILITOT 0.9  PROT 6.3*  ALBUMIN 2.8*   No results for input(s): LIPASE, AMYLASE in the last 168 hours. No results for input(s): AMMONIA in the last 168 hours. Coagulation Profile: Recent Labs  Lab 05/14/20 0850  INR 1.1   Cardiac Enzymes: No results for input(s): CKTOTAL, CKMB, CKMBINDEX, TROPONINI in the last 168 hours. BNP (last 3 results) No results for input(s): PROBNP in the last 8760 hours. HbA1C: No results for input(s): HGBA1C in the last 72 hours. CBG: No results for input(s): GLUCAP in the last 168 hours. Lipid Profile: No results for input(s): CHOL, HDL, LDLCALC, TRIG, CHOLHDL, LDLDIRECT in the last 72 hours. Thyroid Function Tests: No results for input(s): TSH, T4TOTAL, FREET4, T3FREE, THYROIDAB in the last 72 hours. Anemia Panel: No results for input(s): VITAMINB12, FOLATE, FERRITIN, TIBC, IRON, RETICCTPCT in the last 72 hours. Sepsis Labs: Recent Labs  Lab 05/14/20 0850 05/14/20 1142 05/15/20 0310  PROCALCITON  --   --  19.73  LATICACIDVEN 2.1* 1.4 1.0    Recent Results (from the past 240 hour(s))  Resp Panel  by RT-PCR (Flu A&B, Covid) Nasopharyngeal Swab     Status: None   Collection Time: 05/14/20  8:50 AM   Specimen: Nasopharyngeal Swab; Nasopharyngeal(NP) swabs in vial transport medium  Result Value Ref Range Status   SARS Coronavirus 2 by RT PCR  NEGATIVE NEGATIVE Final    Comment: (NOTE) SARS-CoV-2 target nucleic acids are NOT DETECTED.  The SARS-CoV-2 RNA is generally detectable in upper respiratory specimens during the acute phase of infection. The lowest concentration of SARS-CoV-2 viral copies this assay can detect is 138 copies/mL. A negative result does not preclude SARS-Cov-2 infection and should not be used as the sole basis for treatment or other patient management decisions. A negative result may occur with  improper specimen collection/handling, submission of specimen other than nasopharyngeal swab, presence of viral mutation(s) within the areas targeted by this assay, and inadequate number of viral copies(<138 copies/mL). A negative result must be combined with clinical observations, patient history, and epidemiological information. The expected result is Negative.  Fact Sheet for Patients:  EntrepreneurPulse.com.au  Fact Sheet for Healthcare Providers:  IncredibleEmployment.be  This test is no t yet approved or cleared by the [place] FDA and  has been authorized for detection and/or diagnosis of SARS-CoV-2 by FDA under an Emergency Use Authorization (EUA). This EUA will remain  in effect (meaning this test can be used) for the duration of the COVID-19 declaration under Section 564(b)(1) of the Act, 21 U.S.C.section 360bbb-3(b)(1), unless the authorization is terminated  or revoked sooner.       Influenza A by PCR NEGATIVE NEGATIVE Final   Influenza B by PCR NEGATIVE NEGATIVE Final    Comment: (NOTE) The Xpert Xpress SARS-CoV-2/FLU/RSV plus assay is intended as an aid in the diagnosis of influenza from Nasopharyngeal swab specimens and should not be used as a sole basis for treatment. Nasal washings and aspirates are unacceptable for Xpert Xpress SARS-CoV-2/FLU/RSV testing.  Fact Sheet for Patients: EntrepreneurPulse.com.au  Fact Sheet for  Healthcare Providers: IncredibleEmployment.be  This test is not yet approved or cleared by the [place] FDA and has been authorized for detection and/or diagnosis of SARS-CoV-2 by FDA under an Emergency Use Authorization (EUA). This EUA will remain in effect (meaning this test can be used) for the duration of the COVID-19 declaration under Section 564(b)(1) of the Act, 21 U.S.C. section 360bbb-3(b)(1), unless the authorization is terminated or revoked.  Performed at [facility], [place] [address] [place], [place] [address]   Culture, blood (routine x 2)     Status: None (Preliminary result)   Collection Time: 05/14/20  8:50 AM   Specimen: BLOOD  Result Value Ref Range Status   Specimen Description   Final    BLOOD RIGHT ANTECUBITAL Performed at [place] [address] [place], [place] [address]    Special Requests   Final    BOTTLES DRAWN AEROBIC AND ANAEROBIC Blood Culture results may not be optimal due to an inadequate volume of blood received in culture bottles Performed at [place] [address], [place], [place] [address]    Culture  Setup Time   Final    GRAM POSITIVE COCCI IN CHAINS AEROBIC BOTTLE ONLY Performed at [facility], [place] [address], [place], [place] [address]    Culture GRAM POSITIVE COCCI  Final   Report Status PENDING  Incomplete  Culture, blood (routine x 2)     Status: None (Preliminary result)   Collection Time: 05/14/20  8:50  AM   Specimen: BLOOD  Result Value Ref Range Status   Specimen Description   Final    BLOOD LEFT ANTECUBITAL Performed at [facility] [place] [address], [place], [place] [address]    Special Requests   Final    BOTTLES DRAWN AEROBIC AND ANAEROBIC Blood Culture results may not be optimal due to an inadequate volume of blood received in culture bottles Performed at [place] [address] [place], [place] [address]    Culture  Setup Time   Final    GRAM POSITIVE COCCI AEROBIC BOTTLE ONLY Organism ID to follow Performed at [facility], [place] [address] [place], [place] [address]    Culture PENDING  Incomplete   Report Status PENDING  Incomplete  MRSA PCR Screening     Status: None   Collection Time: 05/14/20 12:39 PM   Specimen: Nasopharyngeal  Result Value Ref Range Status   MRSA by PCR NEGATIVE NEGATIVE Final    Comment:        The GeneXpert MRSA Assay (FDA approved for NASAL specimens only), is one component of a comprehensive MRSA colonization surveillance program. It is not intended to diagnose MRSA infection nor to guide or monitor treatment for MRSA infections. Performed at [facility], [place] [address], [place], [place] [address]   Respiratory (~20 pathogens) panel by PCR     Status: None   Collection Time: 05/14/20  8:00 PM  Result Value Ref Range Status   Adenovirus NOT DETECTED NOT DETECTED Final   Coronavirus 229E NOT DETECTED NOT DETECTED Final    Comment: (NOTE) The Coronavirus on the Respiratory Panel, DOES NOT test for the novel  Coronavirus (2019 nCoV)    Coronavirus HKU1 NOT DETECTED NOT DETECTED Final   Coronavirus NL63 NOT DETECTED NOT DETECTED Final   Coronavirus OC43 NOT DETECTED NOT DETECTED Final   Metapneumovirus NOT DETECTED NOT DETECTED Final   Rhinovirus / Enterovirus NOT DETECTED NOT DETECTED Final   Influenza A NOT DETECTED NOT DETECTED Final   Influenza B NOT DETECTED NOT DETECTED Final   Parainfluenza Virus 1 NOT DETECTED NOT DETECTED Final   Parainfluenza Virus 2 NOT DETECTED NOT DETECTED Final   Parainfluenza Virus 3 NOT DETECTED NOT DETECTED Final   Parainfluenza Virus 4 NOT DETECTED NOT DETECTED Final   Respiratory Syncytial Virus NOT DETECTED NOT DETECTED Final   Bordetella pertussis NOT DETECTED NOT DETECTED Final   Bordetella Parapertussis NOT DETECTED NOT DETECTED Final   Chlamydophila  pneumoniae NOT DETECTED NOT DETECTED Final   Mycoplasma pneumoniae NOT DETECTED NOT DETECTED Final    Comment: Performed at [facility], [place]. [address], [place],  [address]         Radiology Studies: CT Angio Chest PE W and/or Wo Contrast  Result Date: 05/14/2020 CLINICAL DATA:  PE suspected.  History of lung cancer. EXAM: CT ANGIOGRAPHY CHEST WITH CONTRAST TECHNIQUE: Multidetector CT imaging of the chest was performed using the standard protocol during bolus administration of intravenous contrast. Multiplanar CT image reconstructions and MIPs were obtained to evaluate the vascular anatomy. CONTRAST:  41mL OMNIPAQUE IOHEXOL 350 MG/ML SOLN COMPARISON:  08/15/2017 FINDINGS: Cardiovascular: Satisfactory opacification of the pulmonary arteries to the segmental level. No evidence of pulmonary embolism. Normal heart size. No pericardial effusion. Aortic atherosclerosis. Mediastinum/Nodes: No discrete thyroid nodule. Retained secretions are identified within the trachea. The esophagus is unremarkable. Prominent mediastinal lymph nodes are noted, none of which meet CT criteria for  adenopathy. No hilar adenopathy. Lungs/Pleura: Small bilateral pleural effusions are identified, left greater right. Moderate to advanced changes of paraseptal and centrilobular emphysema with diffuse bronchial wall thickening. Bilateral multifocal geographic areas of airspace consolidation identified along with diffuse ground-glass attenuation. In the acute setting findings are likely secondary to multifocal pneumonia. Underlying recurrent tumor in this patient who has a history of stage IV lung cancer would be difficult to exclude. Upper Abdomen: No acute abnormality. Left upper pole kidney stones measure up to 1.0 cm. There is a new left adrenal nodule measuring 1.7 x 3.7 cm, image 293/5. Posterior right hepatic lobe hypodensity measures 1.4 cm and is not significantly changed from previous exam. Musculoskeletal:  Spondylosis noted throughout the thoracic spine. No acute or suspicious osseous findings. Review of the MIP images confirms the above findings. IMPRESSION: 1. No evidence for acute pulmonary embolus. 2. Bilateral multifocal geographic areas of airspace consolidation and ground-glass attenuation are identified. In the acute setting findings are likely secondary to multifocal pneumonia. Underlying recurrent tumor in this patient who has a history of stage IV lung cancer would be difficult to exclude. Follow-up imaging is recommended to ensure resolution. In the absence of resolution tissue sampling and or PET CT may be helpful for further evaluation 3. New left adrenal nodule worrisome for metastatic disease. 4. Diffuse bronchial wall thickening with emphysema, as above; imaging findings suggestive of underlying COPD. 5. Emphysema and aortic atherosclerosis. Aortic Atherosclerosis (ICD10-I70.0) and Emphysema (ICD10-J43.9). Electronically Signed   By: Kerby Moors M.D.   On: 05/14/2020 12:38   DG Chest Port 1 View  Result Date: 05/14/2020 CLINICAL DATA:  Pt on 2.5-3L O2 continuous but has to be increased when pt gets up. Pt has lung cancer. Pt comes in on NRB. Pt was treated for lung infection 3 weeks ago but had to be taken off medication due to AMS and increased dizziness. EXAM: PORTABLE CHEST - 1 VIEW COMPARISON:  08/21/2017 FINDINGS: Interval development of extensive airspace opacities throughout both lungs, with relative sparing in the right upper lobe, and more focal consolidation in the right mid lung. There is blunting of the left lateral costophrenic angle suggesting small effusion. Heart size remains normal. No pneumothorax. Vertebral endplate spurring at multiple levels in the mid thoracic spine. IMPRESSION: Interval development of extensive bilateral airspace infiltrates or edema. Electronically Signed   By: Lucrezia Europe M.D.   On: 05/14/2020 08:52        Scheduled Meds: . aspirin EC  81 mg Oral  Daily  . chlorhexidine  15 mL Mouth Rinse BID  . Chlorhexidine Gluconate Cloth  6 each Topical Daily  . diltiazem  180 mg Oral Daily  . enoxaparin (LOVENOX) injection  40 mg Subcutaneous Q24H  . hydrochlorothiazide  12.5 mg Oral Daily  . levothyroxine  75 mcg Oral QAC breakfast  . mouth rinse  15 mL Mouth Rinse q12n4p  . methylPREDNISolone (SOLU-MEDROL) injection  40 mg Intravenous Q8H  . sodium chloride flush  3 mL Intravenous Q12H   Continuous Infusions: . sodium chloride    . ceFEPime (MAXIPIME) IV Stopped (05/15/20 0601)  . dextrose 5% lactated ringers 75 mL/hr at 05/15/20 0700  . vancomycin       Time spent: 60 minutes with over 50% of the time coordinating the patient's care    Harold Hedge, DO [facility]   Call night coverage person covering after 7pm

## 2020-05-15 NOTE — Progress Notes (Signed)
NAME:  Alex Cherry, MRN:  [id number], DOB:  1933-12-08, LOS: 1 ADMISSION DATE:  05/14/2020, CONSULTATION DATE:  4/24/222 REFERRING MD:  Dr Derrill Kay, CHIEF COMPLAINT:   Acute resp failure  History of Present Illness:   87 year old male.  History is provided by the daughter, review of the medical records and also Dr. Shanon Brow of [facility].  He is a retired Archivist.  He Has Advanced Stage IV Adenocarcinoma of the Lung As Documented below.  Up until the End of March 2022 in Early April 2022 His Functional Status ECOG Was 2 and He Decided Full Medical Care and Code.  It Appears after His Visit at [facility] He Was Given Moxifloxacin for an Undetermined "Lung Infection".  He Took This for 5 Days and by April 25, 2020 He Became Bedbound with Confusion and Dizziness.  Started Becoming Hypoxemic [New Hypoxemia] Requiring 2 L Oxygen -3 L oxygen .  After that he was placed on some  Antidizziness medication which he took till May 03, 2020.  This was then stopped because of lack of improvement.  Subsequently started improving.  His shortness of breath started getting better.  His functional status started getting better.  His appetite started getting better.  Then abruptly on the day of admission 05/14/2020 he started having chills/rigors [although no fever at home and since then] was found to be profoundly hypoxemic and brought into the emergency department where he was in respiratory distress and placed on BiPAP.  Was transferred to the ICU bed 1229.  Critical care medicine consulted.  At the time of critical care evaluation he was breathing 20 times a minute slightly paradoxical but speaking full sentences alert and calm.  Family indicated that he is actually significantly better.  CT angiogram chest visualized.  Shows old pleural effusion.  Shows emphysema.  Shows significant pulm infiltrates [pneumonia versus lung cancer].  Pulmonary embolism has been ruled out.  Patient is full code.  Daughter and son  indicate openness to more detailed goals of care discussion after getting clarity on true prognosis from [facility] oncology.  Dr. Shanon Brow of [facility] hospitalist has attempted to reach [facility] transfer center but the line was busy.  Alex Cherry is being empirically treated with antibiotics Labs also indicate acute kidney injury: Baseline creatinine 1.0 mg percent March 2022 at [place] History   Lung cancer hx at [facility] - last visti 04/21/20  diagnosis of Stage IV lung adenocarcinoma with Right malignant pleural effusion.   ONCOLOGIC PROBLEM LIST:   1) metastatic adenocarcinoma wihtout mutation in Foundation testing  - 08/14/17 presented w 4-d cough and SOB, CXR Right lung base consolidation consistent with pneumonia in the proper clinical setting. - 08/15/17 CTA negative for pulmonary embolism, Moderate layering right pleural effusion. Mild associated atelectasis. Negative for pneumonia. Small pulmonary nodules measuring up to 5 mm. Emphysema with biapical pleural based scarring. small calcification along the right diaphragm with mild progression from 2014. No diffuse or contralateral pleural plaque.  - 08/15/17 CT Abdomen negative for acute findings. Chronic left renal staghorn calculus without obstructive uropathy. Sigmoid diverticulosis without active inflammation. Progressed lumbar spine degeneration since 2014, but no acute osseous abnormality identified. - 08/19/17 ECHO : LVEF 60-65%, LV and RV wall motion was normal. RV cavity size and wall thickness were also normal. - 08/21/17 R thoracentesis removed 1,750 ml fluid. Cytology pos adenocarcinoma. - 09/05/17 right pleurx placed, removed 700 ml effusion. - 09/12/17 pleurodesis.  - 09/13/17 MRI brain  NED metastatic disease.  - 09/24/17 CT chest bilat lung nodules up to 1.2 cm. NED metastases a/p. [place] LE negative DVT.  - 10/01/17 start Cycle one carbo 4/ pemetrexed 400 / pembro.  - 11/24/17 CT chest multiple bilat lung nodules and right  pleural effusion decreased. - 01/19/18 transitioned to maintenance pem/pembro. - 02/11/18 CT chest stable w new GGO inflammatory.  - 03/02/18 pos influenza A, Tamiflu x 7 days.  - 2/29- 03/15/18 admission for hypoxia/ PNA. CT angio no PE, increased consolidative opacities in bilat lung concerning drug toxicity and infection, treated w ABX and pred 40 mg q 3 day taper. - 05/18/18 CT chest increase small RUL subpleural nodule. Decrease mediastinal and bilat hilar LAD. Small pleural effusion R. - 08/19/18 CT chest increase RUL subpleural mass.  - 09/02/18 PET avid RUL subpleural mass, right perivertebral pleura at T5 and T10. More diffuse pleural hypermetabolism in right lateral costophrenic sulcus.  - 09/15/18 resumed pembrolizumab.  - 11/23/18 CT chest increase in right pleural mass.  - 12/1 - 01/01/19 SBRT 48 Gy to right lung (medial), SBRT 48 Gy to right lung (lateral).  - 01/07/19 MRI brain There is a punctate acute right cerebellar infarct. There is no evidence for metastatic disease. unchanged marked prominence of the CSF within the bilateral optic nerve sheaths. - 01/10/19 resumed pembro.  - 04/12/19 CT chest decrease in right pleural masses.  - 07/05/19 CT chest stable to decrease in right peripheral irregular opacity. - 8/1/20221 - thoracentesis on 08/22/19 yielding positive cytology for adenocarcinoma  - 10/01/19 CT cap new L4, bilat iliac bone (up to 2.8 cm) and right inferior pubic ramus bone lesions concerning new osseous mets.  - 10/19/19 bone scan increased radiotracer activity in right iliac bone and L4 vertebra concerning osseous metastatic disease.  - 10/20-11/2/21 RT L3-L4 / pelvis 30 Gy. - 12/10/19 resume pembro. - 01/28/20 CT cap increase in R lung peripheral consolidation extending to R hilum, increase in R hilar LAD, new left adrenal lesion concerning metastasis. - 02/25/20 started gem 1000 mg/m2 q 2 wks.  - 04/04/20 pt reported mid and lower back pain w leg weakness. MRI total spine  Indeterminate marrow signal abnormality in the T9 vertebral body which may represent osseous metastatic disease versus atypical vertebral body hemangioma. Progression of soft tissue metastatic disease in the right T11-T12 intercostal space and osseous metastatic disease noted in the posterior aspect of the right fifth and sixth ribs. Left adrenal lesion and signal abnormality throughout the right lung, consistent with primary lung neoplasm and adrenal metastatic disease. At L2-L3 and L3-L4, there is severe degenerative spinal canal narrowing - 04/18/20 fever and DOE, CT angio no PE, increase in consolidative process in right midlung w extension to the hilum. Increase in mediastinal LNs.     = 04/21/20 ECOG Per Dule: ECOG Performance Status: Grade 2 : Ambulatory and capable of all selfcare but unable to carry out any work activities. Up and about more than 50% of waking hours   - 4!522 per daughter "  Dad is getting in his wheelchair with help and grooming himself in the morning. He is eating and drinking his water. I feel he needs light physical therapy also  Plan April 2022 visit  - Cancer pain  - Rx opioids and DJD L spone - CAncer: RTC 2 week for Goals discussion and chemo if stable Perf status     has a past medical history of Arthritis, Asthma, Blood in urine, Cancer ([place]), Chronic kidney disease,  Constipation, Cough, Difficulty urinating, Easy bruising, Hearing loss, Hyperparathyroidism, Hypertension, Nasal congestion, Trouble swallowing, and Wheezing.   reports that he quit smoking about 26 years ago. His smoking use included cigarettes. He quit after 50.00 years of use. He has never used smokeless tobacco. Allergies  Allergen Reactions  . Diphenhydramine Hives, Itching and Nausea Only    All over the body   . Penicillins Hives, Itching and Nausea Only    All over the body  . Septra [Sulfamethoxazole W/Trimethoprim (Co-Trimoxazole)] Hives, Itching and Nausea Only    All over the body  .  Moxifloxacin Hcl     Dizziness, weakness     Significant Hospital Events: Including procedures, antibiotic start and stop dates in addition to other pertinent events   . 4/24 BiPAP  Interim History / Subjective:   Remains on BiPAP overnight, 35%. Afebrile Son Destiny and grandson at bedside. Urine output picking up  Objective   Blood pressure (!) 162/75, pulse 73, temperature 98.2 F (36.8 C), temperature source Axillary, resp. rate (!) 27, height 5\' 8"  (1.727 m), weight 83.7 kg, SpO2 94 %.    FiO2 (%):  [35 %] 35 %   Intake/Output Summary (Last 24 hours) at 05/15/2020 0957 Last data filed at 05/15/2020 0800 Gross per 24 hour  Intake 2159.33 ml  Output 750 ml  Net 1409.33 ml   Filed Weights   05/14/20 1127 05/14/20 1300  Weight: 84.1 kg 83.7 kg    Examination: General: Elderly male lying in bed with BiPAP on HENT: BiPAP on.  No elevated JVP no neck nodes. Lungs: No accessory muscle use, mild barrel chest, no rhonchi, decreased breath sounds on right Cardiovascular: Tachycardic regular rate and rhythm Abdomen: Obese Extremities: No cyanosis no clubbing no edema Neuro: Alert, interactive, appears weak and deconditioned GU: Not examined  Labs/imaging that I havepersonally reviewed  (right click and "Reselect all SmartList Selections" daily)   Normal electrolytes, stable creatinine 1.3, high procalcitonin , decrease leukocytosis, mild thrombocytopenia  Resolved Hospital Problem list   X  Assessment & Plan:  Baseline: Metastatic stage IV lung cancer and 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 Admit/current: Acute hypoxemic and hypercapnic respiratory failure     -severe    -Differential diagnosis includes: Progressive malignancy versus HCAP versus viral illnesses  Plan - BiPAP as needed, hopeful that he can tolerate longer breaks of BiPAP and eventually come off -DNR issued after discussion with [facility] hospitalist and  primary oncologist at [facility], agree with care  limitations  -Bronchodilators -IV steroids for empiric COPD exacerbation  Enterococcal bacteremia/sepsis , likely urinary source Possible  HCAP  Plan  - He has a remote penicillin allergy, unable to describe, discussed with ID and they will change from vancomycin to Unasyn  - levoflox 4/24 - 4/24 (advisd alternate agent)  Acute kidney injury-probably due to dehydration [has a history of stones]  Plan - Hydration and reassess -Renal ultrasound depending on course  Elevated troponin  Plan - await echocardiogram - Telemetry monitoring       Best practice (right click and "Reselect all SmartList Selections" daily)  Diet:  NPO Pain/Anxiety/Delirium protocol (if indicated): Yes (RASS goal 0) - chronic opiods. Pain Rx by [facility] VAP protocol (if indicated): Not indicated DVT prophylaxis: LMWH GI prophylaxis: PPI Glucose control:  SSI Yes Central venous access:  N/A Arterial line:  N/A Foley:  N/A Mobility:  bed rest  PT consulted: N/A Last date of multidisciplinary goals of care discussion 4/25, DNR issued Code Status: DNR Disposition: ICU  ATTESTATION & SIGNATURE   Kara Mead MD. Shade Flood. [place] [facility] Pager : [phone]  If no response to pager , please call [phone] until 7 pm After 7:00 pm call Elink  [phone]   05/15/2020    05/15/2020 9:57 AM    LABS    PULMONARY Recent Labs  Lab 05/14/20 0849 05/14/20 1520  PHART 7.393 7.399  PCO2ART 58.2* 58.4*  PO2ART 182* 72.3*  HCO3 34.7* 35.4*  O2SAT 99.6 94.7    CBC Recent Labs  Lab 05/14/20 0850 05/14/20 1142 05/15/20 0751  HGB 11.7* 10.8* 10.0*  HCT 39.3 35.3* 33.5*  WBC 18.6* 29.7* 21.3*  PLT 225 205 175    COAGULATION Recent Labs  Lab 05/14/20 0850  INR 1.1    CARDIAC  No results for input(s): TROPONINI in the last 168 hours. No results for input(s): PROBNP in the last 168 hours.   CHEMISTRY Recent Labs  Lab 05/14/20 0850 05/14/20 1142  05/15/20 0751  NA 144  --  144  K 3.1*  --  3.8  CL 102  --  105  CO2 30  --  31  GLUCOSE 112*  --  168*  BUN 18  --  23  CREATININE 1.37* 1.39* 1.33*  CALCIUM 10.6*  --  10.3   Estimated Creatinine Clearance: 43.6 mL/min (A) (by C-G formula based on SCr of 1.33 mg/dL (H)).   LIVER Recent Labs  Lab 05/14/20 0850 05/15/20 0751  AST 23 20  ALT 20 18  ALKPHOS 94 81  BILITOT 0.9 0.9  PROT 6.3* 5.8*  ALBUMIN 2.8* 2.4*  INR 1.1  --      INFECTIOUS Recent Labs  Lab 05/14/20 0850 05/14/20 1142 05/15/20 0310  LATICACIDVEN 2.1* 1.4 1.0  PROCALCITON  --   --  19.73     ENDOCRINE CBG (last 3)  No results for input(s): GLUCAP in the last 72 hours.    Kara Mead MD. Shade Flood. [place] [facility] Pager : [phone]  If no response to pager , please call [phone] until 7 pm After 7:00 pm call Elink  [phone]   05/15/2020

## 2020-05-15 NOTE — Progress Notes (Signed)
  Echocardiogram 2D Echocardiogram has been performed.  Alex Cherry 05/15/2020, 3:05 PM

## 2020-05-15 NOTE — Consult Note (Signed)
Date of Admission:  05/14/2020          Reason for Consult: Enterococcus faecalis bacteremia    Referring Provider: [facility] auto consult and Dr. Neysa Bonito   Assessment:  1. Enterococcus faecalis bacteremia 2. Stage IV lung cancer that is progressing 3. Possible superimposed bacterial pneumonia to #2 4. Severe hypoxemic and hypercapnic respiratory failure 5. COPD 6. Penicillin allergy as a child 7. Intolerance of levofloxacin  Plan:  1. Will challenge with IV amp sulbactam and if he does well with this continue this to optimally treat his Enterococcus faecalis as well as providing coverage for anaerobes in the lungs 2. DC vancomycin and cefepime 3. Follow-up results of 2D echocardiogram 4. Repeat blood cultures  Principal Problem:   Acute on chronic respiratory failure with hypoxia and hypercapnia (HCC) Active Problems:   Stage 4 lung cancer ([place])   HCAP (healthcare-associated pneumonia)   Sepsis ([place])   AKI (acute kidney injury) ([place])   Declining functional status   Poor prognosis   Goals of care, counseling/discussion   COPD (chronic obstructive pulmonary disease) (HCC)   Elevated troponin   Scheduled Meds: . aspirin EC  81 mg Oral Daily  . chlorhexidine  15 mL Mouth Rinse BID  . Chlorhexidine Gluconate Cloth  6 each Topical Daily  . diltiazem  180 mg Oral Daily  . enoxaparin (LOVENOX) injection  40 mg Subcutaneous Q24H  . hydrochlorothiazide  12.5 mg Oral Daily  . levothyroxine  75 mcg Oral QAC breakfast  . mouth rinse  15 mL Mouth Rinse q12n4p  . methylPREDNISolone (SOLU-MEDROL) injection  40 mg Intravenous Q8H  . sodium chloride flush  3 mL Intravenous Q12H   Continuous Infusions: . sodium chloride    . ampicillin-sulbactam (UNASYN) IV    . dextrose 5% lactated ringers 75 mL/hr at 05/15/20 0823   PRN Meds:.sodium chloride, albuterol, lip balm, sodium chloride flush  HPI: Alex Cherry is a 87 y.o. male history of COPD and also stage IV adenocarcinoma of  the lung.  He has been having progression of his lung cancer recently with radiographic progression and worsening clinical symptoms.  He is requiring more more oxygen and feeling for progressively more weak and short of breath.  He had been recently treated for pneumonia with Avelox but developed adverse reaction to this antibiotic including confusion dizziness and weakness.  Is admitted to the ICU with progressive respiratory failure.  CT scan of the lungs shows no pulmonary embolism but bilateral multifocal airspace consolidations with groundglass attenuation.  Area concerning for possible secondary bacterial infection versus spread of his stage IV lung cancer.  Has a new left renal nodule that is also seen is worrisome for further metastases.  He was admitted to the ICU and has been placed on BiPAP.  He has had blood cultures taken and he was placed on vancomycin and cefepime.  Since then his blood cultures returned positive for Enterococcus faecalis.  Involved in terms of the source of the Enterococcus faecalis I suspect he potentially could have had an aspiration event seeded the lungs and in the blood.  Certainly his bacteria is more commonly found in the GI tract and associated with GI pathology or GU pathology.  Is not clear that he has either of those from his history though.  He did have pyuria but denies having had dysuria or suprapubic discomfort.  Does not have any obvious wounds he has a bruise in his left arm.   He has history  of childhood allergy to penicillin which in the chart is described as a rash all over his body.  Patient himself cannot remember the event.  His time numbers all recall that he is oh he said that he has had an allergy to penicillin.  Does not appear to have received amoxicillin or Augmentin.  I suspect that if he indeed had an allergy to penicillin as a child and she was only diffuse rash he likely has lost this allergy in the ensuing years.  Certainly it  is very common for viral exanthems to be thought to be due to penicillins though he fwiw has several family members also w PCN allergy  Patient and family were agreeable to trial of ampicillin sulbactam to see if he does develop a rash or is able to tolerate this drug.  It was better to give him ampicillin for Enterococcus faecalis and ampicillin sulbactam provides nice coverage for anaerobic flora but could be in the lungs as well.  Repeat blood cultures and follow-up on 2D echocardiogram.  Would not be in favor of advocating for a transesophageal echocardiogram given his overall condition prognosis.   Review of Systems: Review of Systems  Constitutional: Positive for chills, fever and weight loss. Negative for diaphoresis.  HENT: Negative for congestion, hearing loss, sore throat and tinnitus.   Eyes: Negative for blurred vision and double vision.  Respiratory: Positive for cough. Negative for sputum production, shortness of breath and wheezing.   Cardiovascular: Negative for chest pain, palpitations and leg swelling.  Gastrointestinal: Negative for abdominal pain, blood in stool, constipation, diarrhea, heartburn, melena, nausea and vomiting.  Genitourinary: Negative for dysuria, flank pain and hematuria.  Musculoskeletal: Negative for back pain, falls, joint pain and myalgias.  Skin: Negative for itching and rash.  Neurological: Negative for dizziness, sensory change, focal weakness, loss of consciousness, weakness and headaches.  Endo/Heme/Allergies: Bruises/bleeds easily.  Psychiatric/Behavioral: Negative for depression, memory loss and suicidal ideas. The patient is not nervous/anxious.     Past Medical History:  Diagnosis Date  . Arthritis   . Asthma    as a child  . Blood in urine   . Cancer ([place])   . Chronic kidney disease    rt ureteral calculus  . Constipation   . Cough   . Difficulty urinating   . Easy bruising   . Hearing loss   . Hyperparathyroidism   .  Hypertension    Denies any other heart  conditions, no chest pain or arrtthy.  . Nasal congestion   . Trouble swallowing   . Wheezing     Social History   Tobacco Use  . Smoking status: Former Smoker    Years: 50.00    Types: Cigarettes    Quit date: 12/03/1993    Years since quitting: 26.4  . Smokeless tobacco: Never Used  Substance Use Topics  . Alcohol use: Yes  . Drug use: No    Family History  Problem Relation Age of Onset  . Cancer Sister    Allergies  Allergen Reactions  . Diphenhydramine Hives, Itching and Nausea Only    All over the body   . Penicillins Hives, Itching and Nausea Only    All over the body  . Septra [Sulfamethoxazole W/Trimethoprim (Co-Trimoxazole)] Hives, Itching and Nausea Only    All over the body  . Moxifloxacin Hcl     Dizziness, weakness    OBJECTIVE: Blood pressure (!) 173/97, pulse 65, temperature 98.2 F (36.8 C), temperature source Axillary, resp.  rate (!) 34, height 5\' 8"  (1.727 m), weight 83.7 kg, SpO2 93 %.  Physical Exam Constitutional:      General: He is not in acute distress.    Appearance: Normal appearance. He is well-developed. He is not ill-appearing or diaphoretic.  HENT:     Head: Normocephalic and atraumatic.     Right Ear: Hearing and external ear normal.     Left Ear: Hearing and external ear normal.     Nose: No nasal deformity or rhinorrhea.  Eyes:     General: No scleral icterus.    Extraocular Movements: Extraocular movements intact.     Conjunctiva/sclera: Conjunctivae normal.     Right eye: Right conjunctiva is not injected.     Left eye: Left conjunctiva is not injected.     Pupils: Pupils are equal, round, and reactive to light.  Neck:     Vascular: No JVD.  Cardiovascular:     Rate and Rhythm: Regular rhythm. Tachycardia present.     Heart sounds: S1 normal and S2 normal. No murmur heard. No friction rub.  Pulmonary:     Comments: On BIPAP Abdominal:     General: Bowel sounds are normal.  There is no distension.     Palpations: Abdomen is soft. There is no mass.     Tenderness: There is no abdominal tenderness.     Hernia: No hernia is present.  Musculoskeletal:        General: Normal range of motion.     Right shoulder: Normal.     Left shoulder: Normal.     Cervical back: Normal range of motion and neck supple.     Right hip: Normal.     Left hip: Normal.     Right knee: Normal.     Left knee: Normal.  Lymphadenopathy:     Head:     Right side of head: No submandibular, preauricular or posterior auricular adenopathy.     Left side of head: No submandibular, preauricular or posterior auricular adenopathy.     Cervical: No cervical adenopathy.     Right cervical: No superficial or deep cervical adenopathy.    Left cervical: No superficial or deep cervical adenopathy.  Skin:    General: Skin is warm and dry.     Coloration: Skin is pale.     Findings: Bruising present. No abrasion, ecchymosis, erythema, lesion or rash.     Nails: There is no clubbing.  Neurological:     General: No focal deficit present.     Mental Status: He is alert and oriented to person, place, and time.     Sensory: No sensory deficit.     Coordination: Coordination normal.     Gait: Gait normal.  Psychiatric:        Attention and Perception: He is attentive.        Mood and Affect: Mood normal.        Speech: Speech normal.        Behavior: Behavior normal. Behavior is cooperative.        Thought Content: Thought content normal.        Judgment: Judgment normal.     Lab Results Lab Results  Component Value Date   WBC 21.3 (H) 05/15/2020   HGB 10.0 (L) 05/15/2020   HCT 33.5 (L) 05/15/2020   MCV 109.1 (H) 05/15/2020   PLT 175 05/15/2020    Lab Results  Component Value Date   CREATININE 1.33 (H) 05/15/2020   BUN  23 05/15/2020   NA 144 05/15/2020   K 3.8 05/15/2020   CL 105 05/15/2020   CO2 31 05/15/2020    Lab Results  Component Value Date   ALT 18 05/15/2020   AST 20  05/15/2020   ALKPHOS 81 05/15/2020   BILITOT 0.9 05/15/2020     Microbiology: Recent Results (from the past 240 hour(s))  Resp Panel by RT-PCR (Flu A&B, Covid) Nasopharyngeal Swab     Status: None   Collection Time: 05/14/20  8:50 AM   Specimen: Nasopharyngeal Swab; Nasopharyngeal(NP) swabs in vial transport medium  Result Value Ref Range Status   SARS Coronavirus 2 by RT PCR NEGATIVE NEGATIVE Final    Comment: (NOTE) SARS-CoV-2 target nucleic acids are NOT DETECTED.  The SARS-CoV-2 RNA is generally detectable in upper respiratory specimens during the acute phase of infection. The lowest concentration of SARS-CoV-2 viral copies this assay can detect is 138 copies/mL. A negative result does not preclude SARS-Cov-2 infection and should not be used as the sole basis for treatment or other patient management decisions. A negative result may occur with  improper specimen collection/handling, submission of specimen other than nasopharyngeal swab, presence of viral mutation(s) within the areas targeted by this assay, and inadequate number of viral copies(<138 copies/mL). A negative result must be combined with clinical observations, patient history, and epidemiological information. The expected result is Negative.  Fact Sheet for Patients:  EntrepreneurPulse.com.au  Fact Sheet for Healthcare Providers:  IncredibleEmployment.be  This test is no t yet approved or cleared by the [place] FDA and  has been authorized for detection and/or diagnosis of SARS-CoV-2 by FDA under an Emergency Use Authorization (EUA). This EUA will remain  in effect (meaning this test can be used) for the duration of the COVID-19 declaration under Section 564(b)(1) of the Act, 21 U.S.C.section 360bbb-3(b)(1), unless the authorization is terminated  or revoked sooner.       Influenza A by PCR NEGATIVE NEGATIVE Final   Influenza B by PCR NEGATIVE NEGATIVE Final     Comment: (NOTE) The Xpert Xpress SARS-CoV-2/FLU/RSV plus assay is intended as an aid in the diagnosis of influenza from Nasopharyngeal swab specimens and should not be used as a sole basis for treatment. Nasal washings and aspirates are unacceptable for Xpert Xpress SARS-CoV-2/FLU/RSV testing.  Fact Sheet for Patients: EntrepreneurPulse.com.au  Fact Sheet for Healthcare Providers: IncredibleEmployment.be  This test is not yet approved or cleared by the [place] FDA and has been authorized for detection and/or diagnosis of SARS-CoV-2 by FDA under an Emergency Use Authorization (EUA). This EUA will remain in effect (meaning this test can be used) for the duration of the COVID-19 declaration under Section 564(b)(1) of the Act, 21 U.S.C. section 360bbb-3(b)(1), unless the authorization is terminated or revoked.  Performed at [facility], [place] [address], [place], [place] [address]   Culture, blood (routine x 2)     Status: None (Preliminary result)   Collection Time: 05/14/20  8:50 AM   Specimen: BLOOD  Result Value Ref Range Status   Specimen Description   Final    BLOOD RIGHT ANTECUBITAL Performed at [facility] [address], [place], [place] [address]    Special Requests   Final    BOTTLES DRAWN AEROBIC AND ANAEROBIC Blood Culture results may not be optimal due to an inadequate volume of blood received in culture bottles Performed at [facility] [address], [place], [place] [address]    Culture  Setup Time   Final    GRAM POSITIVE COCCI IN CHAINS AEROBIC BOTTLE ONLY Performed at [facility], [place] [address] [place], [place] [address]    Culture GRAM POSITIVE COCCI  Final   Report Status PENDING  Incomplete  Culture, blood (routine x 2)     Status: None (Preliminary result)   Collection Time: 05/14/20  8:50 AM   Specimen: BLOOD  Result Value Ref Range Status    Specimen Description   Final    BLOOD LEFT ANTECUBITAL Performed at [facility] [address] [place], [place] [address]    Special Requests   Final    BOTTLES DRAWN AEROBIC AND ANAEROBIC Blood Culture results may not be optimal due to an inadequate volume of blood received in culture bottles Performed at [facility] [address], [place], [place] [address]    Culture  Setup Time   Final    GRAM POSITIVE COCCI AEROBIC BOTTLE ONLY CRITICAL RESULT CALLED TO, READ BACK BY AND VERIFIED WITH: PHARMD M BELL [id number] AT 66 AM  BY CM Performed at [facility], [place] [address] [place], [place] [address]    Culture GRAM POSITIVE COCCI  Final   Report Status PENDING  Incomplete  Blood Culture ID Panel (Reflexed)     Status: Abnormal   Collection Time: 05/14/20  8:50 AM  Result Value Ref Range Status   Enterococcus faecalis DETECTED (A) NOT DETECTED Final    Comment: CRITICAL RESULT CALLED TO, READ BACK BY AND VERIFIED WITH: PHARMD M BELL [id number] AT 807 AM BY CM    Enterococcus Faecium NOT DETECTED NOT DETECTED Final   Listeria monocytogenes NOT DETECTED NOT DETECTED Final   Staphylococcus species NOT DETECTED NOT DETECTED Final   Staphylococcus aureus (BCID) NOT DETECTED NOT DETECTED Final   Staphylococcus epidermidis NOT DETECTED NOT DETECTED Final   Staphylococcus lugdunensis NOT DETECTED NOT DETECTED Final   Streptococcus species NOT DETECTED NOT DETECTED Final   Streptococcus agalactiae NOT DETECTED NOT DETECTED Final   Streptococcus pneumoniae NOT DETECTED NOT DETECTED Final   Streptococcus pyogenes NOT DETECTED NOT DETECTED Final   A.calcoaceticus-baumannii NOT DETECTED NOT DETECTED Final   Bacteroides fragilis NOT DETECTED NOT DETECTED Final   Enterobacterales NOT DETECTED NOT DETECTED Final   Enterobacter cloacae complex NOT DETECTED NOT DETECTED Final   Escherichia coli NOT DETECTED NOT DETECTED Final   Klebsiella aerogenes NOT  DETECTED NOT DETECTED Final   Klebsiella oxytoca NOT DETECTED NOT DETECTED Final   Klebsiella pneumoniae NOT DETECTED NOT DETECTED Final   Proteus species NOT DETECTED NOT DETECTED Final   Salmonella species NOT DETECTED NOT DETECTED Final   Serratia marcescens NOT DETECTED NOT DETECTED Final   Haemophilus influenzae NOT DETECTED NOT DETECTED Final   Neisseria meningitidis NOT DETECTED NOT DETECTED Final   Pseudomonas aeruginosa NOT DETECTED NOT DETECTED Final   Stenotrophomonas maltophilia NOT DETECTED NOT DETECTED Final   Candida albicans NOT DETECTED NOT DETECTED Final   Candida auris NOT DETECTED NOT DETECTED Final   Candida glabrata NOT DETECTED NOT DETECTED Final   Candida krusei NOT DETECTED NOT DETECTED Final   Candida parapsilosis NOT DETECTED NOT DETECTED Final   Candida tropicalis NOT DETECTED NOT DETECTED Final   Cryptococcus neoformans/gattii NOT DETECTED NOT DETECTED Final   Vancomycin resistance NOT DETECTED NOT DETECTED Final    Comment: Performed at [facility], [address] [place], [place] [address]  MRSA PCR Screening  Status: None   Collection Time: 05/14/20 12:39 PM   Specimen: Nasopharyngeal  Result Value Ref Range Status   MRSA by PCR NEGATIVE NEGATIVE Final    Comment:        The GeneXpert MRSA Assay (FDA approved for NASAL specimens only), is one component of a comprehensive MRSA colonization surveillance program. It is not intended to diagnose MRSA infection nor to guide or monitor treatment for MRSA infections. Performed at [facility], [place] [address] [place], [place] [address]   Respiratory (~20 pathogens) panel by PCR     Status: None   Collection Time: 05/14/20  8:00 PM  Result Value Ref Range Status   Adenovirus NOT DETECTED NOT DETECTED Final   Coronavirus 229E NOT DETECTED NOT DETECTED Final    Comment: (NOTE) The Coronavirus on the Respiratory Panel, DOES NOT test for the novel  Coronavirus (2019 nCoV)     Coronavirus HKU1 NOT DETECTED NOT DETECTED Final   Coronavirus NL63 NOT DETECTED NOT DETECTED Final   Coronavirus OC43 NOT DETECTED NOT DETECTED Final   Metapneumovirus NOT DETECTED NOT DETECTED Final   Rhinovirus / Enterovirus NOT DETECTED NOT DETECTED Final   Influenza A NOT DETECTED NOT DETECTED Final   Influenza B NOT DETECTED NOT DETECTED Final   Parainfluenza Virus 1 NOT DETECTED NOT DETECTED Final   Parainfluenza Virus 2 NOT DETECTED NOT DETECTED Final   Parainfluenza Virus 3 NOT DETECTED NOT DETECTED Final   Parainfluenza Virus 4 NOT DETECTED NOT DETECTED Final   Respiratory Syncytial Virus NOT DETECTED NOT DETECTED Final   Bordetella pertussis NOT DETECTED NOT DETECTED Final   Bordetella Parapertussis NOT DETECTED NOT DETECTED Final   Chlamydophila pneumoniae NOT DETECTED NOT DETECTED Final   Mycoplasma pneumoniae NOT DETECTED NOT DETECTED Final    Comment: Performed at [facility], [place]. [address], [place],  [address]    Alcide Evener, [place] [facility] [phone] pager  05/15/2020, 11:20 AM

## 2020-05-15 NOTE — Progress Notes (Signed)
Arrived in PT room - PT on 4 LPM versus BiPAP. PT states that he is breathing "fine." RN states that CCM MD stated to use BiPAP PRN going forward.

## 2020-05-15 NOTE — Progress Notes (Signed)
2D echo attempted, but patient eating. Will try later 

## 2020-05-15 NOTE — Progress Notes (Signed)
PHARMACY - PHYSICIAN COMMUNICATION CRITICAL VALUE ALERT - BLOOD CULTURE IDENTIFICATION (BCID)  Alex Cherry is an 87 y.o. male who presented to [facility] on 05/14/2020 with a chief complaint of acute respiratory failure and sepsis due to PNA.    Assessment:  BCx GPC in aerobic bottle in both sets (2/4 bottles, note inadequate volume of blood in bottles).  BCID: E. Faecalis with no resistance detected.  Name of physician (or Provider) Contacted: Dr Neysa Bonito  Current antibiotics: Cefepime, Vancomycin  Changes to prescribed antibiotics recommended:   Continue Vancomycin and Cefepime per MD at this time.     Results for orders placed or performed during the hospital encounter of 05/14/20  Blood Culture ID Panel (Reflexed) (Collected: 05/14/2020  8:50 AM)  Result Value Ref Range   Enterococcus faecalis DETECTED (A) NOT DETECTED   Enterococcus Faecium NOT DETECTED NOT DETECTED   Listeria monocytogenes NOT DETECTED NOT DETECTED   Staphylococcus species NOT DETECTED NOT DETECTED   Staphylococcus aureus (BCID) NOT DETECTED NOT DETECTED   Staphylococcus epidermidis NOT DETECTED NOT DETECTED   Staphylococcus lugdunensis NOT DETECTED NOT DETECTED   Streptococcus species NOT DETECTED NOT DETECTED   Streptococcus agalactiae NOT DETECTED NOT DETECTED   Streptococcus pneumoniae NOT DETECTED NOT DETECTED   Streptococcus pyogenes NOT DETECTED NOT DETECTED   A.calcoaceticus-baumannii NOT DETECTED NOT DETECTED   Bacteroides fragilis NOT DETECTED NOT DETECTED   Enterobacterales NOT DETECTED NOT DETECTED   Enterobacter cloacae complex NOT DETECTED NOT DETECTED   Escherichia coli NOT DETECTED NOT DETECTED   Klebsiella aerogenes NOT DETECTED NOT DETECTED   Klebsiella oxytoca NOT DETECTED NOT DETECTED   Klebsiella pneumoniae NOT DETECTED NOT DETECTED   Proteus species NOT DETECTED NOT DETECTED   Salmonella species NOT DETECTED NOT DETECTED   Serratia marcescens NOT DETECTED NOT DETECTED   Haemophilus  influenzae NOT DETECTED NOT DETECTED   Neisseria meningitidis NOT DETECTED NOT DETECTED   Pseudomonas aeruginosa NOT DETECTED NOT DETECTED   Stenotrophomonas maltophilia NOT DETECTED NOT DETECTED   Candida albicans NOT DETECTED NOT DETECTED   Candida auris NOT DETECTED NOT DETECTED   Candida glabrata NOT DETECTED NOT DETECTED   Candida krusei NOT DETECTED NOT DETECTED   Candida parapsilosis NOT DETECTED NOT DETECTED   Candida tropicalis NOT DETECTED NOT DETECTED   Cryptococcus neoformans/gattii NOT DETECTED NOT DETECTED   Vancomycin resistance NOT DETECTED NOT DETECTED   Gretta Arab PharmD, BCPS Clinical Pharmacist [facility] main pharmacy [phone] 05/15/2020 8:26 AM

## 2020-05-16 ENCOUNTER — Other Ambulatory Visit (HOSPITAL_COMMUNITY): Payer: Self-pay

## 2020-05-16 DIAGNOSIS — R300 Dysuria: Secondary | ICD-10-CM

## 2020-05-16 DIAGNOSIS — R5381 Other malaise: Secondary | ICD-10-CM

## 2020-05-16 DIAGNOSIS — B952 Enterococcus as the cause of diseases classified elsewhere: Secondary | ICD-10-CM

## 2020-05-16 DIAGNOSIS — R0600 Dyspnea, unspecified: Secondary | ICD-10-CM

## 2020-05-16 LAB — CBC
HCT: 35.1 % — ABNORMAL LOW (ref 39.0–52.0)
Hemoglobin: 10.7 g/dL — ABNORMAL LOW (ref 13.0–17.0)
MCH: 32.7 pg (ref 26.0–34.0)
MCHC: 30.5 g/dL (ref 30.0–36.0)
MCV: 107.3 fL — ABNORMAL HIGH (ref 80.0–100.0)
Platelets: 200 10*3/uL (ref 150–400)
RBC: 3.27 MIL/uL — ABNORMAL LOW (ref 4.22–5.81)
RDW: 17.4 % — ABNORMAL HIGH (ref 11.5–15.5)
WBC: 22.6 10*3/uL — ABNORMAL HIGH (ref 4.0–10.5)
nRBC: 0 % (ref 0.0–0.2)

## 2020-05-16 LAB — LEGIONELLA PNEUMOPHILA SEROGP 1 UR AG: L. pneumophila Serogp 1 Ur Ag: NEGATIVE

## 2020-05-16 LAB — BASIC METABOLIC PANEL
Anion gap: 6 (ref 5–15)
BUN: 28 mg/dL — ABNORMAL HIGH (ref 8–23)
CO2: 33 mmol/L — ABNORMAL HIGH (ref 22–32)
Calcium: 10.7 mg/dL — ABNORMAL HIGH (ref 8.9–10.3)
Chloride: 105 mmol/L (ref 98–111)
Creatinine, Ser: 1.04 mg/dL (ref 0.61–1.24)
GFR, Estimated: >60 mL/min (ref >60)
Glucose, Bld: 181 mg/dL — ABNORMAL HIGH (ref 70–99)
Potassium: 3.3 mmol/L — ABNORMAL LOW (ref 3.5–5.1)
Sodium: 144 mmol/L (ref 135–145)

## 2020-05-16 LAB — PROCALCITONIN: Procalcitonin: 9.83 ng/mL

## 2020-05-16 MED ORDER — POTASSIUM CHLORIDE CRYS ER 20 MEQ PO TBCR
20.0000 meq | EXTENDED_RELEASE_TABLET | Freq: Once | ORAL | Status: AC
  Administered 2020-05-16: 20 meq via ORAL
  Filled 2020-05-16: qty 1

## 2020-05-16 NOTE — Progress Notes (Signed)
Subjective: He feels much better   Antibiotics:  Anti-infectives (From admission, onward)   Start     Dose/Rate Route Frequency Ordered Stop   05/16/20 0900  levofloxacin (LEVAQUIN) IVPB 750 mg  Status:  Discontinued        750 mg 100 mL/hr over 90 Minutes Intravenous Every 48 hours 05/14/20 1142 05/14/20 1537   05/15/20 1200  vancomycin (VANCOCIN) IVPB 1000 mg/200 mL premix  Status:  Discontinued        1,000 mg 200 mL/hr over 60 Minutes Intravenous Every 24 hours 05/14/20 1143 05/15/20 1000   05/15/20 1200  Ampicillin-Sulbactam (UNASYN) 3 g in sodium chloride 0.9 % 100 mL IVPB        3 g 200 mL/hr over 30 Minutes Intravenous Every 6 hours 05/15/20 1000     05/14/20 1700  ceFEPIme (MAXIPIME) 2 g in sodium chloride 0.9 % 100 mL IVPB  Status:  Discontinued        2 g 200 mL/hr over 30 Minutes Intravenous Every 12 hours 05/14/20 1554 05/15/20 1000   05/14/20 1430  vancomycin (VANCOREADY) IVPB 750 mg/150 mL  Status:  Discontinued        750 mg 150 mL/hr over 60 Minutes Intravenous NOW 05/14/20 1337 05/14/20 1338   05/14/20 1145  vancomycin (VANCOREADY) IVPB 750 mg/150 mL        750 mg 150 mL/hr over 60 Minutes Intravenous NOW 05/14/20 1143 05/14/20 1527   05/14/20 1100  levofloxacin (LEVAQUIN) IVPB 750 mg  Status:  Discontinued        750 mg 100 mL/hr over 90 Minutes Intravenous Every 24 hours 05/14/20 1058 05/14/20 1142   05/14/20 0900  vancomycin (VANCOCIN) IVPB 1000 mg/200 mL premix        1,000 mg 200 mL/hr over 60 Minutes Intravenous  Once 05/14/20 0847 05/14/20 1007   05/14/20 0900  levofloxacin (LEVAQUIN) IVPB 750 mg        750 mg 100 mL/hr over 90 Minutes Intravenous  Once 05/14/20 0847 05/14/20 1035      Medications: Scheduled Meds: . aspirin EC  81 mg Oral Daily  . chlorhexidine  15 mL Mouth Rinse BID  . Chlorhexidine Gluconate Cloth  6 each Topical Daily  . diltiazem  180 mg Oral Daily  . enoxaparin (LOVENOX) injection  40 mg Subcutaneous Q24H  .  hydrochlorothiazide  12.5 mg Oral Daily  . levothyroxine  75 mcg Oral QAC breakfast  . mouth rinse  15 mL Mouth Rinse q12n4p  . methylPREDNISolone (SOLU-MEDROL) injection  40 mg Intravenous Q8H  . sodium chloride flush  3 mL Intravenous Q12H   Continuous Infusions: . sodium chloride 10 mL/hr at 05/16/20 0631  . ampicillin-sulbactam (UNASYN) IV 3 g (05/16/20 1149)  . dextrose 5% lactated ringers 75 mL/hr at 05/16/20 0725   PRN Meds:.sodium chloride, albuterol, lip balm, sodium chloride flush    Objective: Weight change:   Intake/Output Summary (Last 24 hours) at 05/16/2020 1723 Last data filed at 05/16/2020 1500 Gross per 24 hour  Intake 1834.07 ml  Output 675 ml  Net 1159.07 ml   Blood pressure 138/61, pulse 62, temperature 98 F (36.7 C), temperature source Oral, resp. rate 20, height 5\' 8"  (1.727 m), weight 83.7 kg, SpO2 93 %. Temp:  [97.3 F (36.3 C)-98.2 F (36.8 C)] 98 F (36.7 C) (04/26 1435) Pulse Rate:  [62-77] 62 (04/26 1435) Resp:  [15-24] 20 (04/26 1435) BP: (131-160)/(61-81) 138/61 (04/26 1435) SpO2:  [92 %-  97 %] 93 % (04/26 1435)  Physical Exam: Physical Exam Constitutional:      Appearance: He is well-developed.  HENT:     Head: Normocephalic and atraumatic.  Eyes:     Conjunctiva/sclera: Conjunctivae normal.  Cardiovascular:     Rate and Rhythm: Normal rate and regular rhythm.     Heart sounds: No murmur heard. No friction rub. No gallop.   Pulmonary:     Effort: Pulmonary effort is normal. No respiratory distress.     Breath sounds: Normal breath sounds. No stridor. No wheezing or rhonchi.  Abdominal:     General: There is no distension.     Palpations: Abdomen is soft.  Musculoskeletal:        General: Normal range of motion.     Cervical back: Normal range of motion and neck supple.  Skin:    General: Skin is warm and dry.     Findings: No erythema or rash.  Neurological:     General: No focal deficit present.     Mental Status: He is  alert and oriented to person, place, and time.  Psychiatric:        Mood and Affect: Mood normal.        Behavior: Behavior normal.        Thought Content: Thought content normal.        Judgment: Judgment normal.      CBC:    BMET Recent Labs    05/15/20 0751 05/16/20 0500  NA 144 144  K 3.8 3.3*  CL 105 105  CO2 31 33*  GLUCOSE 168* 181*  BUN 23 28*  CREATININE 1.33* 1.04  CALCIUM 10.3 10.7*     Liver Panel  Recent Labs    05/14/20 0850 05/15/20 0751  PROT 6.3* 5.8*  ALBUMIN 2.8* 2.4*  AST 23 20  ALT 20 18  ALKPHOS 94 81  BILITOT 0.9 0.9       Sedimentation Rate No results for input(s): ESRSEDRATE in the last 72 hours. C-Reactive Protein No results for input(s): CRP in the last 72 hours.  Micro Results: Recent Results (from the past 720 hour(s))  Resp Panel by RT-PCR (Flu A&B, Covid) Nasopharyngeal Swab     Status: None   Collection Time: 05/14/20  8:50 AM   Specimen: Nasopharyngeal Swab; Nasopharyngeal(NP) swabs in vial transport medium  Result Value Ref Range Status   SARS Coronavirus 2 by RT PCR NEGATIVE NEGATIVE Final    Comment: (NOTE) SARS-CoV-2 target nucleic acids are NOT DETECTED.  The SARS-CoV-2 RNA is generally detectable in upper respiratory specimens during the acute phase of infection. The lowest concentration of SARS-CoV-2 viral copies this assay can detect is 138 copies/mL. A negative result does not preclude SARS-Cov-2 infection and should not be used as the sole basis for treatment or other patient management decisions. A negative result may occur with  improper specimen collection/handling, submission of specimen other than nasopharyngeal swab, presence of viral mutation(s) within the areas targeted by this assay, and inadequate number of viral copies(<138 copies/mL). A negative result must be combined with clinical observations, patient history, and epidemiological information. The expected result is Negative.  Fact Sheet  for Patients:  EntrepreneurPulse.com.au  Fact Sheet for Healthcare Providers:  IncredibleEmployment.be  This test is no t yet approved or cleared by the [place] FDA and  has been authorized for detection and/or diagnosis of SARS-CoV-2 by FDA under an Emergency Use Authorization (EUA). This EUA will remain  in effect (meaning  this test can be used) for the duration of the COVID-19 declaration under Section 564(b)(1) of the Act, 21 U.S.C.section 360bbb-3(b)(1), unless the authorization is terminated  or revoked sooner.       Influenza A by PCR NEGATIVE NEGATIVE Final   Influenza B by PCR NEGATIVE NEGATIVE Final    Comment: (NOTE) The Xpert Xpress SARS-CoV-2/FLU/RSV plus assay is intended as an aid in the diagnosis of influenza from Nasopharyngeal swab specimens and should not be used as a sole basis for treatment. Nasal washings and aspirates are unacceptable for Xpert Xpress SARS-CoV-2/FLU/RSV testing.  Fact Sheet for Patients: EntrepreneurPulse.com.au  Fact Sheet for Healthcare Providers: IncredibleEmployment.be  This test is not yet approved or cleared by the [place] FDA and has been authorized for detection and/or diagnosis of SARS-CoV-2 by FDA under an Emergency Use Authorization (EUA). This EUA will remain in effect (meaning this test can be used) for the duration of the COVID-19 declaration under Section 564(b)(1) of the Act, 21 U.S.C. section 360bbb-3(b)(1), unless the authorization is terminated or revoked.  Performed at [facility], [place] [address] [place], [place] [address]   Culture, blood (routine x 2)     Status: Abnormal (Preliminary result)   Collection Time: 05/14/20  8:50 AM   Specimen: BLOOD  Result Value Ref Range Status   Specimen Description   Final    BLOOD RIGHT ANTECUBITAL Performed at [facility] [address]  [place], [place] [address]    Special Requests   Final    BOTTLES DRAWN AEROBIC AND ANAEROBIC Blood Culture results may not be optimal due to an inadequate volume of blood received in culture bottles Performed at [facility] [address], [place], [place] [address]    Culture  Setup Time   Final    GRAM POSITIVE COCCI IN CHAINS IN BOTH AEROBIC AND ANAEROBIC BOTTLES CRITICAL VALUE NOTED.  VALUE IS CONSISTENT WITH PREVIOUSLY REPORTED AND CALLED VALUE. Performed at [facility], [place] [address] [place], [place] [address]    Culture ENTEROCOCCUS FAECALIS (A)  Final   Report Status PENDING  Incomplete  Culture, blood (routine x 2)     Status: Abnormal (Preliminary result)   Collection Time: 05/14/20  8:50 AM   Specimen: BLOOD  Result Value Ref Range Status   Specimen Description   Final    BLOOD LEFT ANTECUBITAL Performed at [facility] [address] [place],  [address]    Special Requests   Final    BOTTLES DRAWN AEROBIC AND ANAEROBIC Blood Culture results may not be optimal due to an inadequate volume of blood received in culture bottles Performed at [facility] [address] [place], [place] [address]    Culture  Setup Time   Final    GRAM POSITIVE COCCI IN BOTH AEROBIC AND ANAEROBIC BOTTLES CRITICAL RESULT CALLED TO, READ BACK BY AND VERIFIED WITH: PHARMD M BELL [id number] AT 67 AM  BY CM    Culture (A)  Final    ENTEROCOCCUS FAECALIS SUSCEPTIBILITIES TO FOLLOW Performed at [facility], [place] [address] [place],  [address]    Report Status PENDING  Incomplete  Blood Culture ID Panel (Reflexed)     Status: Abnormal   Collection Time: 05/14/20  8:50 AM  Result Value Ref Range Status   Enterococcus faecalis DETECTED (A) NOT DETECTED Final    Comment: CRITICAL RESULT CALLED TO, READ BACK BY AND VERIFIED WITH: PHARMD  M BELL [id number] AT 502 AM BY CM    Enterococcus Faecium NOT DETECTED NOT  DETECTED Final   Listeria monocytogenes NOT DETECTED NOT DETECTED Final   Staphylococcus species NOT DETECTED NOT DETECTED Final   Staphylococcus aureus (BCID) NOT DETECTED NOT DETECTED Final   Staphylococcus epidermidis NOT DETECTED NOT DETECTED Final   Staphylococcus lugdunensis NOT DETECTED NOT DETECTED Final   Streptococcus species NOT DETECTED NOT DETECTED Final   Streptococcus agalactiae NOT DETECTED NOT DETECTED Final   Streptococcus pneumoniae NOT DETECTED NOT DETECTED Final   Streptococcus pyogenes NOT DETECTED NOT DETECTED Final   A.calcoaceticus-baumannii NOT DETECTED NOT DETECTED Final   Bacteroides fragilis NOT DETECTED NOT DETECTED Final   Enterobacterales NOT DETECTED NOT DETECTED Final   Enterobacter cloacae complex NOT DETECTED NOT DETECTED Final   Escherichia coli NOT DETECTED NOT DETECTED Final   Klebsiella aerogenes NOT DETECTED NOT DETECTED Final   Klebsiella oxytoca NOT DETECTED NOT DETECTED Final   Klebsiella pneumoniae NOT DETECTED NOT DETECTED Final   Proteus species NOT DETECTED NOT DETECTED Final   Salmonella species NOT DETECTED NOT DETECTED Final   Serratia marcescens NOT DETECTED NOT DETECTED Final   Haemophilus influenzae NOT DETECTED NOT DETECTED Final   Neisseria meningitidis NOT DETECTED NOT DETECTED Final   Pseudomonas aeruginosa NOT DETECTED NOT DETECTED Final   Stenotrophomonas maltophilia NOT DETECTED NOT DETECTED Final   Candida albicans NOT DETECTED NOT DETECTED Final   Candida auris NOT DETECTED NOT DETECTED Final   Candida glabrata NOT DETECTED NOT DETECTED Final   Candida krusei NOT DETECTED NOT DETECTED Final   Candida parapsilosis NOT DETECTED NOT DETECTED Final   Candida tropicalis NOT DETECTED NOT DETECTED Final   Cryptococcus neoformans/gattii NOT DETECTED NOT DETECTED Final   Vancomycin resistance NOT DETECTED NOT DETECTED Final    Comment: Performed at [facility], [address] [place], [place] [address]  Urine culture      Status: Abnormal (Preliminary result)   Collection Time: 05/14/20 10:58 AM   Specimen: Urine, Clean Catch  Result Value Ref Range Status   Specimen Description   Final    URINE, CLEAN CATCH Performed at [facility], [place] [address] [place], [place] [address]    Special Requests   Final    NONE Performed at [facility], [place] [address] [place], [place] [address]    Culture (A)  Final    70,000 COLONIES/mL ENTEROCOCCUS FAECALIS SUSCEPTIBILITIES TO FOLLOW Performed at [facility], [place] [address] [place], [place] [address]    Report Status PENDING  Incomplete  MRSA PCR Screening     Status: None   Collection Time: 05/14/20 12:39 PM   Specimen: Nasopharyngeal  Result Value Ref Range Status   MRSA by PCR NEGATIVE NEGATIVE Final    Comment:        The GeneXpert MRSA Assay (FDA approved for NASAL specimens only), is one component of a comprehensive MRSA colonization surveillance program. It is not intended to diagnose MRSA infection nor to guide or monitor treatment for MRSA infections. Performed at [facility], [place] [address] [place], [place] [address]   Respiratory (~20 pathogens) panel by PCR     Status: None   Collection Time: 05/14/20  8:00 PM  Result Value Ref Range Status   Adenovirus NOT DETECTED NOT DETECTED Final   Coronavirus 229E NOT DETECTED NOT DETECTED Final    Comment: (NOTE) The Coronavirus on the Respiratory Panel, DOES NOT test for  the novel  Coronavirus (2019 nCoV)    Coronavirus HKU1 NOT DETECTED NOT DETECTED Final   Coronavirus NL63 NOT DETECTED NOT DETECTED Final   Coronavirus OC43 NOT DETECTED NOT DETECTED Final   Metapneumovirus NOT DETECTED NOT DETECTED Final   Rhinovirus / Enterovirus NOT DETECTED NOT DETECTED Final   Influenza A NOT DETECTED NOT DETECTED Final   Influenza B NOT DETECTED NOT DETECTED Final   Parainfluenza Virus 1 NOT DETECTED NOT DETECTED Final   Parainfluenza  Virus 2 NOT DETECTED NOT DETECTED Final   Parainfluenza Virus 3 NOT DETECTED NOT DETECTED Final   Parainfluenza Virus 4 NOT DETECTED NOT DETECTED Final   Respiratory Syncytial Virus NOT DETECTED NOT DETECTED Final   Bordetella pertussis NOT DETECTED NOT DETECTED Final   Bordetella Parapertussis NOT DETECTED NOT DETECTED Final   Chlamydophila pneumoniae NOT DETECTED NOT DETECTED Final   Mycoplasma pneumoniae NOT DETECTED NOT DETECTED Final    Comment: Performed at [facility], [place] [address], [place], [place] [address]  Culture, blood (Routine X 2) w Reflex to ID Panel     Status: None (Preliminary result)   Collection Time: 05/15/20 10:18 AM   Specimen: BLOOD  Result Value Ref Range Status   Specimen Description   Final    BLOOD LEFT ANTECUBITAL Performed at [facility] [address], [place], [place] [address]    Special Requests   Final    BOTTLES DRAWN AEROBIC AND ANAEROBIC Blood Culture adequate volume Performed at [facility] [address], [place], [place] [address]    Culture   Final    NO GROWTH < 24 HOURS Performed at [facility] [address] [place], [place] [address]    Report Status PENDING  Incomplete  Culture, blood (Routine X 2) w Reflex to ID Panel     Status: None (Preliminary result)   Collection Time: 05/15/20 10:19 AM   Specimen: BLOOD RIGHT HAND  Result Value Ref Range Status   Specimen Description   Final    BLOOD RIGHT HAND Performed at [facility] [address] [place], [place] [address]    Special Requests   Final    BOTTLES DRAWN AEROBIC ONLY Blood Culture adequate volume Performed at [facility] [address], [place], [place] [address]    Culture   Final    NO GROWTH < 24 HOURS Performed at [facility] [address], [place], [place] [address]    Report Status PENDING  Incomplete  Expectorated Sputum Assessment w Gram Stain, Rflx to  Resp Cult     Status: None   Collection Time: 05/15/20 11:40 AM   Specimen: Expectorated Sputum  Result Value Ref Range Status   Specimen Description EXPSU  Final   Special Requests NONE  Final   Sputum evaluation   Final    THIS SPECIMEN IS ACCEPTABLE FOR SPUTUM CULTURE Performed at [facility], [place] [address] [place], [place] [address]    Report Status 05/15/2020 FINAL  Final  Culture, Respiratory w Gram Stain     Status: None (Preliminary result)   Collection Time: 05/15/20 11:40 AM  Result Value Ref Range Status   Specimen Description   Final    EXPSU Performed at [facility], [place] [address], [place], [place] [address]    Special Requests   Final    NONE Reflexed from [id number] Performed at [facility], [place]  [address] [place], [place] [address]    Gram Stain   Final    RARE WBC PRESENT, PREDOMINANTLY PMN ABUNDANT SQUAMOUS EPITHELIAL CELLS PRESENT ABUNDANT YEAST ABUNDANT GRAM POSITIVE RODS ABUNDANT GRAM VARIABLE ROD    Culture   Final    CULTURE REINCUBATED FOR BETTER GROWTH Performed at [facility], [place] [address] [place],  [address]    Report Status PENDING  Incomplete    Studies/Results: ECHOCARDIOGRAM COMPLETE  Result Date: 05/15/2020    ECHOCARDIOGRAM REPORT   Patient Name:   AZUL BRUMETT Date of Exam: 05/15/2020 Medical Rec #:  [id number]       Height:       68.0 in Accession #:    [id number]      Weight:       184.5 lb Date of Birth:  07-26-1933       BSA:          1.975 m Patient Age:    44 years        BP:           173/97 mmHg Patient Gender: M               HR:           77 bpm. Exam Location:  Inpatient Procedure: 2D Echo, Cardiac Doppler, Color Doppler and Intracardiac            Opacification Agent Indications:    Acute respiratory distress. ; R06.02 SOB  History:        Patient has prior history of Echocardiogram examinations, most                 recent 08/19/2017. COPD,  Signs/Symptoms:Shortness of Breath,                 Dyspnea and Dizziness/Lightheadedness; Risk                 Factors:Hypertension. Metastatic lung cancer. Hypoxia. Elevated                 troponin.  Sonographer:    Roseanna Rainbow RDCS Referring Phys: [id number] [facility]  Sonographer Comments: Technically difficult study due to poor echo windows. Patient in high fowler's position. IMPRESSIONS  1. Left ventricular ejection fraction, by estimation, is 65 to 70%. The left ventricle has normal function. The left ventricle has no regional wall motion abnormalities. There is moderate concentric left ventricular hypertrophy. Left ventricular diastolic parameters are consistent with Grade I diastolic dysfunction (impaired relaxation). Elevated left ventricular end-diastolic pressure.  2. Right ventricular systolic function is normal. The right ventricular size is normal. There is mildly elevated pulmonary artery systolic pressure.  3. The mitral valve is normal in structure. No evidence of mitral valve regurgitation. No evidence of mitral stenosis. Moderate mitral annular calcification.  4. The aortic valve is normal in structure. There is mild calcification of the aortic valve. There is mild thickening of the aortic valve. Aortic valve regurgitation is not visualized. No aortic stenosis is present.  5. The inferior vena cava is normal in size with greater than 50% respiratory variability, suggesting right atrial pressure of 3 mmHg. FINDINGS  Left Ventricle: Left ventricular ejection fraction, by estimation, is 65 to 70%. The left ventricle has normal function. The left ventricle has no regional wall motion abnormalities. Definity contrast agent was given IV to delineate the left ventricular  endocardial borders. The left ventricular internal cavity size was normal in size. There is moderate concentric left  ventricular hypertrophy. Left ventricular diastolic parameters are consistent with Grade I diastolic dysfunction  (impaired relaxation). Elevated left ventricular end-diastolic pressure. Right Ventricle: The right ventricular size is normal. No increase in right ventricular wall thickness. Right ventricular systolic function is normal. There is mildly elevated pulmonary artery systolic pressure. The tricuspid regurgitant velocity is 3.16  m/s, and with an assumed right atrial pressure of 3 mmHg, the estimated right ventricular systolic pressure is 75.1 mmHg. Left Atrium: Left atrial size was normal in size. Right Atrium: Right atrial size was normal in size. Pericardium: There is no evidence of pericardial effusion. Mitral Valve: The mitral valve is normal in structure. Moderate mitral annular calcification. No evidence of mitral valve regurgitation. No evidence of mitral valve stenosis. Tricuspid Valve: The tricuspid valve is normal in structure. Tricuspid valve regurgitation is trivial. No evidence of tricuspid stenosis. Aortic Valve: The aortic valve is normal in structure. There is mild calcification of the aortic valve. There is mild thickening of the aortic valve. Aortic valve regurgitation is not visualized. No aortic stenosis is present. Pulmonic Valve: The pulmonic valve was normal in structure. Pulmonic valve regurgitation is not visualized. No evidence of pulmonic stenosis. Aorta: The aortic root is normal in size and structure. Venous: The inferior vena cava is normal in size with greater than 50% respiratory variability, suggesting right atrial pressure of 3 mmHg. IAS/Shunts: No atrial level shunt detected by color flow Doppler.  LEFT VENTRICLE PLAX 2D LVIDd:         3.30 cm     Diastology LVIDs:         1.80 cm     LV e' medial:    5.33 cm/s LV PW:         1.80 cm     LV E/e' medial:  17.3 LV IVS:        1.60 cm     LV e' lateral:   4.57 cm/s LVOT diam:     1.70 cm     LV E/e' lateral: 20.2 LV SV:         46 LV SV Index:   23 LVOT Area:     2.27 cm  LV Volumes (MOD) LV vol d, MOD A2C: 94.2 ml LV vol d, MOD A4C:  69.2 ml LV vol s, MOD A2C: 26.9 ml LV vol s, MOD A4C: 27.0 ml LV SV MOD A2C:     67.3 ml LV SV MOD A4C:     69.2 ml LV SV MOD BP:      58.2 ml RIGHT VENTRICLE             IVC RV S prime:     14.80 cm/s  IVC diam: 2.10 cm TAPSE (M-mode): 2.4 cm LEFT ATRIUM           Index       RIGHT ATRIUM           Index LA diam:      3.00 cm 1.52 cm/m  RA Area:     11.80 cm LA Vol (A2C): 19.2 ml 9.72 ml/m  RA Volume:   24.90 ml  12.61 ml/m LA Vol (A4C): 26.4 ml 13.37 ml/m  AORTIC VALVE LVOT Vmax:   95.10 cm/s LVOT Vmean:  71.800 cm/s LVOT VTI:    0.201 m  AORTA Ao Root diam: 3.40 cm Ao Asc diam:  2.90 cm MITRAL VALVE                TRICUSPID VALVE MV  Area (PHT): 3.31 cm     TR Peak grad:   39.9 mmHg MV Decel Time: 229 msec     TR Vmax:        316.00 cm/s MV E velocity: 92.40 cm/s MV A velocity: 133.00 cm/s  SHUNTS MV E/A ratio:  0.69         Systemic VTI:  0.20 m                             Systemic Diam: 1.70 cm Skeet Latch MD Electronically signed by Skeet Latch MD Signature Date/Time: 05/15/2020/3:27:44 PM    Final       Assessment/Plan:  INTERVAL HISTORY: urine culture now also + for Enterococcus (after he got abx)  TTE negative for endocarditis   Principal Problem:   Acute on chronic respiratory failure with hypoxia and hypercapnia (HCC) Active Problems:   Stage 4 lung cancer ([place])   HCAP (healthcare-associated pneumonia)   Sepsis ([place])   AKI (acute kidney injury) ([place])   Declining functional status   Poor prognosis   Goals of care, counseling/discussion   COPD (chronic obstructive pulmonary disease) (HCC)   Elevated troponin   Dyspnea    Alex Cherry is a 87 y.o. male with  Progressive metastatic lung cancer admitted with hypoxia and fevers and found to have enterococcal bacteremia.  We now have discovered that he DID have symptoms of dysuria prior to hosptalization  His urine cultures also DID grow E faecalis at 50k (not hig  Colony count but after he received vancomycin,  levaquin  He is going to go home with hospice  Would continue unasyn in the hospital and then DC on   Amoxicillin 1000 mg TID to complete total of 2 weeks of therapy  Will check in on him tomorrow again.   LOS: 2 days   Alcide Evener 05/16/2020, 5:23 PM

## 2020-05-16 NOTE — Progress Notes (Addendum)
Manufacturing engineer [facility]) Hospital Liaison: RN note    Notified by [facility] [place], RN of patient/family request for [facility] services at home after discharge. Chart and patient information reviewed by [facility] physician. Hospice eligibility confirmed.    Writer spoke with patient, daughter and grandson to initiate education related to hospice philosophy, services and team approach to care. Patient and family verbalized understanding of information given. Per discussion, plan is for discharge to home by [facility].   Please send signed and completed DNR form home with patient/family. Patient will need prescriptions for discharge comfort medications.     DME needs have been discussed, patient currently has the following equipment in the home: oxygen, walker, 3N1.  Patient/family requests the following DME for delivery to the home:  Additional oxygen and W/C. [place] equipment manager has been notified and will contact DME provider to arrange delivery to the home. Home address has been verified and is correct in the chart. [name]  is the family member to contact to arrange time of delivery.     [facility] aware of the above. Please notify [facility] when patient is ready to leave the unit at discharge. (Call [phone] or [phone] after 5pm.) [facility] information and contact numbers given to  [facility].      A Please do not hesitate to call with questions.    Thank you,   Farrel Gordon, RN, [place] (listed on [facility] under [place])    [phone]

## 2020-05-16 NOTE — Progress Notes (Signed)
PROGRESS NOTE    Alex Cherry    Code Status: DNR  VOZ:[id number] DOB: [birth year] DOA: 05/14/2020 LOS: 2 days  PCP: Leonard Downing, MD CC:  Chief Complaint  Patient presents with  . Dizziness  . Shortness of Breath       Hospital Summary   This is an 87 year old male with a history of stage 4 adenocarcinoma of the lung being treated at [facility], hypertension, hyperparathyroidism, COPD who presented to the ED on 4/24 with worsening shortness of breath over the past week. Patient was seen by his oncologist on 4/1 and had a GOC discussion at that time and was referred to hospice due to declining health, however the patient wished to remain Full Code and has since been on 2 L/min [place] though his requirements have increased over the past week. He was recently treated for pneumonia with Avelox for 5 days without improvement. He was found to be hypoxic 50% on 5 L/min in the ED and placed on BiPap. CXR showed opacities and WBC was 18 on presentation. He remained full code on admission after hospitalist and PCCM had a [place] discussion. He was admitted for acute hypoxemic/hypercapnic respiratory failure and severe sepsis suspected secondary to HAP and UTI.   4/25: He was found to have Enterococcus bacteremia, likely UTI source and ID was consulted. Hospitalist had a discussion with Tommi Rumps, NP who is his oncology provider who recommended comfort measures and agreed to discuss with the patient's daughter over the phone. Hospitalist had a GOC discussion with the patient, daughter, son and grandson at bedside. Plan was made to change to DNR and pursue hospice. Palliative care was consulted for further assistance. He was transferred out of the SDU as he was able to tolerate [place].  A & P   Principal Problem:   Acute on chronic respiratory failure with hypoxia and hypercapnia (HCC) Active Problems:   Stage 4 lung cancer ([place])   HCAP (healthcare-associated pneumonia)   Sepsis ([place])   AKI (acute kidney  injury) ([place])   Declining functional status   Poor prognosis   Goals of care, counseling/discussion   COPD (chronic obstructive pulmonary disease) (HCC)   Elevated troponin   1. Acute hypoxemic, hypercapnic respiratory failure secondary to HAP vs. Progression of metastatic adenocarcinoma of the lung in the setting of COPD a. Uses 2 L/min O2 at home over the past month, off Bipap and tolerating 4 L/min [place] b. CTA chest - negative for PE, bilateral multifocal airspace consolidation and ground glass likely secondary to multifocal pneumonia and cannot exclude recurrent tumor. New left adrenal nodule worrisome for metastatic disease. Emphysema c. On unasyn for enterococcus bacteremia. Antibiotics per ID d. IV steroids for empiric COPD exacerbation per PCCM  2. Severe sepsis without septic shock secondary to Enterococcus bacteremia suspect secondary to UTI and concern for HAP vs. Progression of malignancy a. Sepsis criteria: tachycardia, tachypnea, leukocytosis, lactic acidosis, worsened o2 requirement, positive UA with bacteremia b. Procal 19->9, Still with leukocytosis c. TTE: EF 65-70% without vegetations noted  d. Plan is to continue IV antibiotics while inpatient and discharge home with hospice on PO antibiotics when OK by ID   3. Elevated Creatinine, not AKI a. Creatinine 1.1 on 04/07/20 in Care Everywhere, peaked at 1.33, down to 1.0 today b. Plan as above  4. Elevated Troponin, suspect demand ischemia a. Trop 74-> 173-> 52 b. Echo findings as above  5. Metastatic adenocarcinoma of the lung a. Discussed with Tommi Rumps, NP -  [facility] who recommends comfort care/hospice   6. [place] discussion a. Prior notes and discussion with the patient's oncology NP and family outside the room indicate that the patient has wished to remain full code despite multiple [place] discussion in the past  b. Hospitalist had an extensive discussion 4/25 with the patient and family regarding prognosis and what  Full Code entails vs. DNR and that it did not mean we would stop our treatments. We also discussed hospice as he had a misconception of what hospice truly was. After clarification, the patient agreed to change his status to DNR and pursue hospice at discharge c. Palliative on board, appreciate assitance  7. Hypertension a. Continue home cardizem   DVT prophylaxis: enoxaparin (LOVENOX) injection 40 mg Start: 05/14/20 1200 SCDs Start: 05/14/20 1057   Family Communication: Grandson at bedside  Disposition Plan: Plan to DC to home with [facility] when medically stable and able to convert to PO antibiotics. Likely 24-48 hours pending ID input. PT Eval  Status is: Inpatient  Remains inpatient appropriate because:Hemodynamically unstable, Unsafe d/c plan, IV treatments appropriate due to intensity of illness or inability to take PO and Inpatient level of care appropriate due to severity of illness   Dispo: The patient is from: Home              Anticipated d/c is to: Home              Patient currently is not medically stable to d/c.   Difficult to place patient No           Pressure injury documentation    None  Consultants  ID PCCM Palliative  Procedures  Bipap  Antibiotics   Anti-infectives (From admission, onward)   Start     Dose/Rate Route Frequency Ordered Stop   05/16/20 0900  levofloxacin (LEVAQUIN) IVPB 750 mg  Status:  Discontinued        750 mg 100 mL/hr over 90 Minutes Intravenous Every 48 hours 05/14/20 1142 05/14/20 1537   05/15/20 1200  vancomycin (VANCOCIN) IVPB 1000 mg/200 mL premix  Status:  Discontinued        1,000 mg 200 mL/hr over 60 Minutes Intravenous Every 24 hours 05/14/20 1143 05/15/20 1000   05/15/20 1200  Ampicillin-Sulbactam (UNASYN) 3 g in sodium chloride 0.9 % 100 mL IVPB        3 g 200 mL/hr over 30 Minutes Intravenous Every 6 hours 05/15/20 1000     05/14/20 1700  ceFEPIme (MAXIPIME) 2 g in sodium chloride 0.9 %  100 mL IVPB  Status:  Discontinued        2 g 200 mL/hr over 30 Minutes Intravenous Every 12 hours 05/14/20 1554 05/15/20 1000   05/14/20 1430  vancomycin (VANCOREADY) IVPB 750 mg/150 mL  Status:  Discontinued        750 mg 150 mL/hr over 60 Minutes Intravenous NOW 05/14/20 1337 05/14/20 1338   05/14/20 1145  vancomycin (VANCOREADY) IVPB 750 mg/150 mL        750 mg 150 mL/hr over 60 Minutes Intravenous NOW 05/14/20 1143 05/14/20 1527   05/14/20 1100  levofloxacin (LEVAQUIN) IVPB 750 mg  Status:  Discontinued        750 mg 100 mL/hr over 90 Minutes Intravenous Every 24 hours 05/14/20 1058 05/14/20 1142   05/14/20 0900  vancomycin (VANCOCIN) IVPB 1000 mg/200 mL premix        1,000 mg 200 mL/hr over 60 Minutes Intravenous  Once 05/14/20  8546 05/14/20 1007   05/14/20 0900  levofloxacin (LEVAQUIN) IVPB 750 mg        750 mg 100 mL/hr over 90 Minutes Intravenous  Once 05/14/20 0847 05/14/20 1035        Subjective   Feeling much better today. Has not ambulated yet and is hoping to work with PT. Tolerating diet. Improved breathing. No complaints or overnight events.   Objective   Vitals:   05/15/20 1747 05/15/20 2121 05/16/20 0150 05/16/20 0531  BP: (!) 160/67 (!) 150/75 (!) 153/73 131/81  Pulse: 77 76 77 68  Resp: 15 (!) 22 (!) 22 (!) 24  Temp:  97.8 F (36.6 C) 98.2 F (36.8 C) (!) 97.3 F (36.3 C)  TempSrc:  Oral Oral   SpO2: 94% 92% 95% 97%  Weight:      Height:        Intake/Output Summary (Last 24 hours) at 05/16/2020 1425 Last data filed at 05/16/2020 0705 Gross per 24 hour  Intake 1755.91 ml  Output 825 ml  Net 930.91 ml   Filed Weights   05/14/20 1127 05/14/20 1300  Weight: 84.1 kg 83.7 kg    Examination:  Physical Exam Vitals and nursing note reviewed.  Constitutional:      Appearance: Normal appearance.  HENT:     Head: Normocephalic and atraumatic.  Eyes:     Conjunctiva/sclera: Conjunctivae normal.  Cardiovascular:     Rate and Rhythm: Normal rate  and regular rhythm.  Pulmonary:     Effort: Pulmonary effort is normal.     Breath sounds: Rales present.  Abdominal:     General: Abdomen is flat.     Palpations: Abdomen is soft.  Musculoskeletal:        General: No swelling or tenderness.     Right lower leg: No edema.     Left lower leg: No edema.  Skin:    Coloration: Skin is not jaundiced or pale.  Neurological:     Mental Status: He is alert. Mental status is at baseline.  Psychiatric:        Mood and Affect: Mood normal.        Behavior: Behavior normal.     Data Reviewed: I have personally reviewed following labs and imaging studies  CBC: Recent Labs  Lab 05/14/20 0850 05/14/20 1142 05/15/20 0751 05/16/20 0500  WBC 18.6* 29.7* 21.3* 22.6*  NEUTROABS 17.9*  --   --   --   HGB 11.7* 10.8* 10.0* 10.7*  HCT 39.3 35.3* 33.5* 35.1*  MCV 107.7* 107.0* 109.1* 107.3*  PLT 225 205 175 270   Basic Metabolic Panel: Recent Labs  Lab 05/14/20 0850 05/14/20 1142 05/15/20 0751 05/16/20 0500  NA 144  --  144 144  K 3.1*  --  3.8 3.3*  CL 102  --  105 105  CO2 30  --  31 33*  GLUCOSE 112*  --  168* 181*  BUN 18  --  23 28*  CREATININE 1.37* 1.39* 1.33* 1.04  CALCIUM 10.6*  --  10.3 10.7*   GFR: Estimated Creatinine Clearance: 55.7 mL/min (by C-G formula based on SCr of 1.04 mg/dL). Liver Function Tests: Recent Labs  Lab 05/14/20 0850 05/15/20 0751  AST 23 20  ALT 20 18  ALKPHOS 94 81  BILITOT 0.9 0.9  PROT 6.3* 5.8*  ALBUMIN 2.8* 2.4*   No results for input(s): LIPASE, AMYLASE in the last 168 hours. No results for input(s): AMMONIA in the last 168 hours. Coagulation  Profile: Recent Labs  Lab 05/14/20 0850  INR 1.1   Cardiac Enzymes: No results for input(s): CKTOTAL, CKMB, CKMBINDEX, TROPONINI in the last 168 hours. BNP (last 3 results) No results for input(s): PROBNP in the last 8760 hours. HbA1C: No results for input(s): HGBA1C in the last 72 hours. CBG: No results for input(s): GLUCAP in the  last 168 hours. Lipid Profile: No results for input(s): CHOL, HDL, LDLCALC, TRIG, CHOLHDL, LDLDIRECT in the last 72 hours. Thyroid Function Tests: No results for input(s): TSH, T4TOTAL, FREET4, T3FREE, THYROIDAB in the last 72 hours. Anemia Panel: No results for input(s): VITAMINB12, FOLATE, FERRITIN, TIBC, IRON, RETICCTPCT in the last 72 hours. Sepsis Labs: Recent Labs  Lab 05/14/20 0850 05/14/20 1142 05/15/20 0310 05/16/20 0500  PROCALCITON  --   --  19.73 9.83  LATICACIDVEN 2.1* 1.4 1.0  --     Recent Results (from the past 240 hour(s))  Resp Panel by RT-PCR (Flu A&B, Covid) Nasopharyngeal Swab     Status: None   Collection Time: 05/14/20  8:50 AM   Specimen: Nasopharyngeal Swab; Nasopharyngeal(NP) swabs in vial transport medium  Result Value Ref Range Status   SARS Coronavirus 2 by RT PCR NEGATIVE NEGATIVE Final    Comment: (NOTE) SARS-CoV-2 target nucleic acids are NOT DETECTED.  The SARS-CoV-2 RNA is generally detectable in upper respiratory specimens during the acute phase of infection. The lowest concentration of SARS-CoV-2 viral copies this assay can detect is 138 copies/mL. A negative result does not preclude SARS-Cov-2 infection and should not be used as the sole basis for treatment or other patient management decisions. A negative result may occur with  improper specimen collection/handling, submission of specimen other than nasopharyngeal swab, presence of viral mutation(s) within the areas targeted by this assay, and inadequate number of viral copies(<138 copies/mL). A negative result must be combined with clinical observations, patient history, and epidemiological information. The expected result is Negative.  Fact Sheet for Patients:  EntrepreneurPulse.com.au  Fact Sheet for Healthcare Providers:  IncredibleEmployment.be  This test is no t yet approved or cleared by the [place] FDA and  has been authorized for  detection and/or diagnosis of SARS-CoV-2 by FDA under an Emergency Use Authorization (EUA). This EUA will remain  in effect (meaning this test can be used) for the duration of the COVID-19 declaration under Section 564(b)(1) of the Act, 21 U.S.C.section 360bbb-3(b)(1), unless the authorization is terminated  or revoked sooner.       Influenza A by PCR NEGATIVE NEGATIVE Final   Influenza B by PCR NEGATIVE NEGATIVE Final    Comment: (NOTE) The Xpert Xpress SARS-CoV-2/FLU/RSV plus assay is intended as an aid in the diagnosis of influenza from Nasopharyngeal swab specimens and should not be used as a sole basis for treatment. Nasal washings and aspirates are unacceptable for Xpert Xpress SARS-CoV-2/FLU/RSV testing.  Fact Sheet for Patients: EntrepreneurPulse.com.au  Fact Sheet for Healthcare Providers: IncredibleEmployment.be  This test is not yet approved or cleared by the [place] FDA and has been authorized for detection and/or diagnosis of SARS-CoV-2 by FDA under an Emergency Use Authorization (EUA). This EUA will remain in effect (meaning this test can be used) for the duration of the COVID-19 declaration under Section 564(b)(1) of the Act, 21 U.S.C. section 360bbb-3(b)(1), unless the authorization is terminated or revoked.  Performed at [facility], [place] [address] [place], [place] [address]   Culture, blood (routine x 2)     Status: Abnormal (Preliminary result)   Collection  Time: 05/14/20  8:50 AM   Specimen: BLOOD  Result Value Ref Range Status   Specimen Description   Final    BLOOD RIGHT ANTECUBITAL Performed at [place] [address] [place], [place] [address]    Special Requests   Final    BOTTLES DRAWN AEROBIC AND ANAEROBIC Blood Culture results may not be optimal due to an inadequate volume of blood received in culture bottles Performed at [place] [address], [place], [place] [address]    Culture  Setup Time   Final    GRAM POSITIVE COCCI IN CHAINS IN BOTH AEROBIC AND ANAEROBIC BOTTLES CRITICAL VALUE NOTED.  VALUE IS CONSISTENT WITH PREVIOUSLY REPORTED AND CALLED VALUE. Performed at [facility], [place] [address] [place], [place] [address]    Culture ENTEROCOCCUS FAECALIS (A)  Final   Report Status PENDING  Incomplete  Culture, blood (routine x 2)     Status: Abnormal (Preliminary result)   Collection Time: 05/14/20  8:50 AM   Specimen: BLOOD  Result Value Ref Range Status   Specimen Description   Final    BLOOD LEFT ANTECUBITAL Performed at [place] [address], [place], [place] [address]    Special Requests   Final    BOTTLES DRAWN AEROBIC AND ANAEROBIC Blood Culture results may not be optimal due to an inadequate volume of blood received in culture bottles Performed at [place] [address] [place], [place] [address]    Culture  Setup Time   Final    GRAM POSITIVE COCCI IN BOTH AEROBIC AND ANAEROBIC BOTTLES CRITICAL RESULT CALLED TO, READ BACK BY AND VERIFIED WITH: PHARMD M BELL [id number] AT 80 AM  BY CM    Culture (A)  Final    ENTEROCOCCUS FAECALIS SUSCEPTIBILITIES TO FOLLOW Performed at [facility], [place] [address] [place], [place] [address]    Report Status PENDING  Incomplete  Blood Culture ID Panel (Reflexed)     Status: Abnormal   Collection Time: 05/14/20  8:50 AM  Result Value Ref Range Status   Enterococcus faecalis DETECTED (A) NOT DETECTED Final    Comment: CRITICAL RESULT CALLED TO, READ BACK BY AND VERIFIED WITH: PHARMD M BELL [id number] AT 807 AM BY CM    Enterococcus Faecium NOT DETECTED NOT DETECTED Final   Listeria monocytogenes NOT DETECTED NOT DETECTED Final   Staphylococcus species NOT DETECTED NOT DETECTED Final   Staphylococcus aureus (BCID) NOT DETECTED NOT DETECTED Final   Staphylococcus epidermidis NOT DETECTED NOT DETECTED  Final   Staphylococcus lugdunensis NOT DETECTED NOT DETECTED Final   Streptococcus species NOT DETECTED NOT DETECTED Final   Streptococcus agalactiae NOT DETECTED NOT DETECTED Final   Streptococcus pneumoniae NOT DETECTED NOT DETECTED Final   Streptococcus pyogenes NOT DETECTED NOT DETECTED Final   A.calcoaceticus-baumannii NOT DETECTED NOT DETECTED Final   Bacteroides fragilis NOT DETECTED NOT DETECTED Final   Enterobacterales NOT DETECTED NOT DETECTED Final   Enterobacter cloacae complex NOT DETECTED NOT DETECTED Final   Escherichia coli NOT DETECTED NOT DETECTED Final   Klebsiella aerogenes NOT DETECTED NOT DETECTED Final   Klebsiella oxytoca NOT DETECTED NOT DETECTED Final   Klebsiella pneumoniae NOT DETECTED NOT DETECTED Final   Proteus species NOT DETECTED NOT DETECTED Final   Salmonella species NOT DETECTED NOT DETECTED Final   Serratia marcescens NOT DETECTED NOT DETECTED Final   Haemophilus influenzae NOT DETECTED NOT DETECTED Final   Neisseria meningitidis  NOT DETECTED NOT DETECTED Final   Pseudomonas aeruginosa NOT DETECTED NOT DETECTED Final   Stenotrophomonas maltophilia NOT DETECTED NOT DETECTED Final   Candida albicans NOT DETECTED NOT DETECTED Final   Candida auris NOT DETECTED NOT DETECTED Final   Candida glabrata NOT DETECTED NOT DETECTED Final   Candida krusei NOT DETECTED NOT DETECTED Final   Candida parapsilosis NOT DETECTED NOT DETECTED Final   Candida tropicalis NOT DETECTED NOT DETECTED Final   Cryptococcus neoformans/gattii NOT DETECTED NOT DETECTED Final   Vancomycin resistance NOT DETECTED NOT DETECTED Final    Comment: Performed at [facility], [place] [address], [place], [place] [address]  Urine culture     Status: Abnormal (Preliminary result)   Collection Time: 05/14/20 10:58 AM   Specimen: Urine, Clean Catch  Result Value Ref Range Status   Specimen Description   Final    URINE, CLEAN CATCH Performed at [facility], [place]  [address] [place], [place] [address]    Special Requests   Final    NONE Performed at [facility], [place] [address], [place], [place] [address]    Culture (A)  Final    70,000 COLONIES/mL ENTEROCOCCUS FAECALIS SUSCEPTIBILITIES TO FOLLOW Performed at [facility], [place] [address] [place], [place] [address]    Report Status PENDING  Incomplete  MRSA PCR Screening     Status: None   Collection Time: 05/14/20 12:39 PM   Specimen: Nasopharyngeal  Result Value Ref Range Status   MRSA by PCR NEGATIVE NEGATIVE Final    Comment:        The GeneXpert MRSA Assay (FDA approved for NASAL specimens only), is one component of a comprehensive MRSA colonization surveillance program. It is not intended to diagnose MRSA infection nor to guide or monitor treatment for MRSA infections. Performed at [facility], [place] [address], [place], [place] [address]   Respiratory (~20 pathogens) panel by PCR     Status: None   Collection Time: 05/14/20  8:00 PM  Result Value Ref Range Status   Adenovirus NOT DETECTED NOT DETECTED Final   Coronavirus 229E NOT DETECTED NOT DETECTED Final    Comment: (NOTE) The Coronavirus on the Respiratory Panel, DOES NOT test for the novel  Coronavirus (2019 nCoV)    Coronavirus HKU1 NOT DETECTED NOT DETECTED Final   Coronavirus NL63 NOT DETECTED NOT DETECTED Final   Coronavirus OC43 NOT DETECTED NOT DETECTED Final   Metapneumovirus NOT DETECTED NOT DETECTED Final   Rhinovirus / Enterovirus NOT DETECTED NOT DETECTED Final   Influenza A NOT DETECTED NOT DETECTED Final   Influenza B NOT DETECTED NOT DETECTED Final   Parainfluenza Virus 1 NOT DETECTED NOT DETECTED Final   Parainfluenza Virus 2 NOT DETECTED NOT DETECTED Final   Parainfluenza Virus 3 NOT DETECTED NOT DETECTED Final   Parainfluenza Virus 4 NOT DETECTED NOT DETECTED Final   Respiratory Syncytial Virus NOT DETECTED NOT DETECTED Final   Bordetella pertussis NOT  DETECTED NOT DETECTED Final   Bordetella Parapertussis NOT DETECTED NOT DETECTED Final   Chlamydophila pneumoniae NOT DETECTED NOT DETECTED Final   Mycoplasma pneumoniae NOT DETECTED NOT DETECTED Final    Comment: Performed at [facility], [place]. [address] [place], [place] [address]  Culture, blood (Routine X 2) w Reflex to ID Panel     Status: None (Preliminary result)   Collection Time: 05/15/20 10:18 AM   Specimen: BLOOD  Result Value Ref Range Status  Specimen Description   Final    BLOOD LEFT ANTECUBITAL Performed at [place] [address], [place], [place] [address]    Special Requests   Final    BOTTLES DRAWN AEROBIC AND ANAEROBIC Blood Culture adequate volume Performed at [place] [address] [place], [place] [address]    Culture   Final    NO GROWTH < 24 HOURS Performed at [facility] [address], [place], [place] [address]    Report Status PENDING  Incomplete  Culture, blood (Routine X 2) w Reflex to ID Panel     Status: None (Preliminary result)   Collection Time: 05/15/20 10:19 AM   Specimen: BLOOD RIGHT HAND  Result Value Ref Range Status   Specimen Description   Final    BLOOD RIGHT HAND Performed at [place] [address] [place], [place] [address]    Special Requests   Final    BOTTLES DRAWN AEROBIC ONLY Blood Culture adequate volume Performed at [place] [address], [place], [place] [address]    Culture   Final    NO GROWTH < 24 HOURS Performed at [place] [address] [place], [place] [address]    Report Status PENDING  Incomplete  Expectorated Sputum Assessment w Gram Stain, Rflx to Resp Cult     Status: None   Collection Time: 05/15/20 11:40 AM   Specimen: Expectorated Sputum  Result Value Ref Range Status   Specimen Description EXPSU  Final   Special Requests NONE  Final   Sputum evaluation   Final    THIS  SPECIMEN IS ACCEPTABLE FOR SPUTUM CULTURE Performed at [facility], [place] [address] [place], [place] [address]    Report Status 05/15/2020 FINAL  Final  Culture, Respiratory w Gram Stain     Status: None (Preliminary result)   Collection Time: 05/15/20 11:40 AM  Result Value Ref Range Status   Specimen Description   Final    EXPSU Performed at [facility], [place] [address], [place], [place] [address]    Special Requests   Final    NONE Reflexed from [id number] Performed at [facility], [place] [address] [place], [place] [address]    Gram Stain   Final    RARE WBC PRESENT, PREDOMINANTLY PMN ABUNDANT SQUAMOUS EPITHELIAL CELLS PRESENT ABUNDANT YEAST ABUNDANT GRAM POSITIVE RODS ABUNDANT GRAM VARIABLE ROD    Culture   Final    CULTURE REINCUBATED FOR BETTER GROWTH Performed at [facility], [place] [address] [place], [place] [address]    Report Status PENDING  Incomplete         Radiology Studies: ECHOCARDIOGRAM COMPLETE  Result Date: 05/15/2020    ECHOCARDIOGRAM REPORT   Patient Name:   Alex Cherry Date of Exam: 05/15/2020 Medical Rec #:  [id number]       Height:       68.0 in Accession #:    [id number]      Weight:       184.5 lb Date of Birth:  Oct 10, 1933       BSA:          1.975 m Patient Age:    32 years        BP:           173/97 mmHg Patient Gender: M  HR:           77 bpm. Exam Location:  Inpatient Procedure: 2D Echo, Cardiac Doppler, Color Doppler and Intracardiac            Opacification Agent Indications:    Acute respiratory distress. ; R06.02 SOB  History:        Patient has prior history of Echocardiogram examinations, most                 recent 08/19/2017. COPD, Signs/Symptoms:Shortness of Breath,                 Dyspnea and Dizziness/Lightheadedness; Risk                 Factors:Hypertension. Metastatic lung cancer. Hypoxia. Elevated                 troponin.  Sonographer:    Roseanna Rainbow RDCS  Referring Phys: [phone] [facility]  Sonographer Comments: Technically difficult study due to poor echo windows. Patient in high fowler's position. IMPRESSIONS  1. Left ventricular ejection fraction, by estimation, is 65 to 70%. The left ventricle has normal function. The left ventricle has no regional wall motion abnormalities. There is moderate concentric left ventricular hypertrophy. Left ventricular diastolic parameters are consistent with Grade I diastolic dysfunction (impaired relaxation). Elevated left ventricular end-diastolic pressure.  2. Right ventricular systolic function is normal. The right ventricular size is normal. There is mildly elevated pulmonary artery systolic pressure.  3. The mitral valve is normal in structure. No evidence of mitral valve regurgitation. No evidence of mitral stenosis. Moderate mitral annular calcification.  4. The aortic valve is normal in structure. There is mild calcification of the aortic valve. There is mild thickening of the aortic valve. Aortic valve regurgitation is not visualized. No aortic stenosis is present.  5. The inferior vena cava is normal in size with greater than 50% respiratory variability, suggesting right atrial pressure of 3 mmHg. FINDINGS  Left Ventricle: Left ventricular ejection fraction, by estimation, is 65 to 70%. The left ventricle has normal function. The left ventricle has no regional wall motion abnormalities. Definity contrast agent was given IV to delineate the left ventricular  endocardial borders. The left ventricular internal cavity size was normal in size. There is moderate concentric left ventricular hypertrophy. Left ventricular diastolic parameters are consistent with Grade I diastolic dysfunction (impaired relaxation). Elevated left ventricular end-diastolic pressure. Right Ventricle: The right ventricular size is normal. No increase in right ventricular wall thickness. Right ventricular systolic function is normal. There is mildly  elevated pulmonary artery systolic pressure. The tricuspid regurgitant velocity is 3.16  m/s, and with an assumed right atrial pressure of 3 mmHg, the estimated right ventricular systolic pressure is 41.6 mmHg. Left Atrium: Left atrial size was normal in size. Right Atrium: Right atrial size was normal in size. Pericardium: There is no evidence of pericardial effusion. Mitral Valve: The mitral valve is normal in structure. Moderate mitral annular calcification. No evidence of mitral valve regurgitation. No evidence of mitral valve stenosis. Tricuspid Valve: The tricuspid valve is normal in structure. Tricuspid valve regurgitation is trivial. No evidence of tricuspid stenosis. Aortic Valve: The aortic valve is normal in structure. There is mild calcification of the aortic valve. There is mild thickening of the aortic valve. Aortic valve regurgitation is not visualized. No aortic stenosis is present. Pulmonic Valve: The pulmonic valve was normal in structure. Pulmonic valve regurgitation is not visualized. No evidence of pulmonic stenosis. Aorta: The aortic root  is normal in size and structure. Venous: The inferior vena cava is normal in size with greater than 50% respiratory variability, suggesting right atrial pressure of 3 mmHg. IAS/Shunts: No atrial level shunt detected by color flow Doppler.  LEFT VENTRICLE PLAX 2D LVIDd:         3.30 cm     Diastology LVIDs:         1.80 cm     LV e' medial:    5.33 cm/s LV PW:         1.80 cm     LV E/e' medial:  17.3 LV IVS:        1.60 cm     LV e' lateral:   4.57 cm/s LVOT diam:     1.70 cm     LV E/e' lateral: 20.2 LV SV:         46 LV SV Index:   23 LVOT Area:     2.27 cm  LV Volumes (MOD) LV vol d, MOD A2C: 94.2 ml LV vol d, MOD A4C: 69.2 ml LV vol s, MOD A2C: 26.9 ml LV vol s, MOD A4C: 27.0 ml LV SV MOD A2C:     67.3 ml LV SV MOD A4C:     69.2 ml LV SV MOD BP:      58.2 ml RIGHT VENTRICLE             IVC RV S prime:     14.80 cm/s  IVC diam: 2.10 cm TAPSE (M-mode): 2.4  cm LEFT ATRIUM           Index       RIGHT ATRIUM           Index LA diam:      3.00 cm 1.52 cm/m  RA Area:     11.80 cm LA Vol (A2C): 19.2 ml 9.72 ml/m  RA Volume:   24.90 ml  12.61 ml/m LA Vol (A4C): 26.4 ml 13.37 ml/m  AORTIC VALVE LVOT Vmax:   95.10 cm/s LVOT Vmean:  71.800 cm/s LVOT VTI:    0.201 m  AORTA Ao Root diam: 3.40 cm Ao Asc diam:  2.90 cm MITRAL VALVE                TRICUSPID VALVE MV Area (PHT): 3.31 cm     TR Peak grad:   39.9 mmHg MV Decel Time: 229 msec     TR Vmax:        316.00 cm/s MV E velocity: 92.40 cm/s MV A velocity: 133.00 cm/s  SHUNTS MV E/A ratio:  0.69         Systemic VTI:  0.20 m                             Systemic Diam: 1.70 cm Skeet Latch MD Electronically signed by Skeet Latch MD Signature Date/Time: 05/15/2020/3:27:44 PM    Final         Scheduled Meds: . aspirin EC  81 mg Oral Daily  . chlorhexidine  15 mL Mouth Rinse BID  . Chlorhexidine Gluconate Cloth  6 each Topical Daily  . diltiazem  180 mg Oral Daily  . enoxaparin (LOVENOX) injection  40 mg Subcutaneous Q24H  . hydrochlorothiazide  12.5 mg Oral Daily  . levothyroxine  75 mcg Oral QAC breakfast  . mouth rinse  15 mL Mouth Rinse q12n4p  . methylPREDNISolone (SOLU-MEDROL) injection  40 mg Intravenous Q8H  .  sodium chloride flush  3 mL Intravenous Q12H   Continuous Infusions: . sodium chloride 10 mL/hr at 05/16/20 0631  . ampicillin-sulbactam (UNASYN) IV 3 g (05/16/20 1149)  . dextrose 5% lactated ringers 75 mL/hr at 05/16/20 0725     Time spent: 25 minutes with over 50% of the time coordinating the patient's care    Harold Hedge, DO [facility]   Call night coverage person covering after 7pm

## 2020-05-16 NOTE — Plan of Care (Signed)

## 2020-05-16 NOTE — Progress Notes (Signed)
Daily Progress Note   Patient Name: Alex Cherry       Date: 05/16/2020 DOB: 12-16-1933  Age: 87 y.o. MRN#: [id number] Attending Physician: Harold Hedge, MD Primary Care Physician: Leonard Downing, MD Admit Date: 05/14/2020  Reason for Consultation/Follow-up: Establishing goals of care  Subjective:  awake alert resting in bed, 2 grandsons at bedside.  Length of Stay: 2  Current Medications: Scheduled Meds:  . aspirin EC  81 mg Oral Daily  . chlorhexidine  15 mL Mouth Rinse BID  . Chlorhexidine Gluconate Cloth  6 each Topical Daily  . diltiazem  180 mg Oral Daily  . enoxaparin (LOVENOX) injection  40 mg Subcutaneous Q24H  . hydrochlorothiazide  12.5 mg Oral Daily  . levothyroxine  75 mcg Oral QAC breakfast  . mouth rinse  15 mL Mouth Rinse q12n4p  . methylPREDNISolone (SOLU-MEDROL) injection  40 mg Intravenous Q8H  . sodium chloride flush  3 mL Intravenous Q12H    Continuous Infusions: . sodium chloride 10 mL/hr at 05/16/20 0631  . ampicillin-sulbactam (UNASYN) IV 3 g (05/16/20 1149)  . dextrose 5% lactated ringers 75 mL/hr at 05/16/20 0725    PRN Meds: sodium chloride, albuterol, lip balm, sodium chloride flush  Physical Exam         Awake alert resting in bed Has supplemental oxygen No edema Mood and affect within normal limits Regular work of breathing  Vital Signs: BP 138/61 (BP Location: Left Arm)   Pulse 62   Temp 98 F (36.7 C) (Oral)   Resp 20   Ht 5\' 8"  (1.727 m)   Wt 83.7 kg   SpO2 93%   BMI 28.06 kg/m  SpO2: SpO2: 93 % O2 Device: O2 Device: Nasal Cannula O2 Flow Rate: O2 Flow Rate (L/min): 4 L/min  Intake/output summary:   Intake/Output Summary (Last 24 hours) at 05/16/2020 1517 Last data filed at 05/16/2020 0705 Gross per 24 hour   Intake 1755.91 ml  Output 825 ml  Net 930.91 ml   LBM: Last BM Date: 05/15/20 Baseline Weight: Weight: 84.1 kg Most recent weight: Weight: 83.7 kg Palliative performance scale 50%.      Palliative Assessment/Data:    Flowsheet Rows   Flowsheet Row Most Recent Value  Intake Tab   Referral Department Hospitalist  Unit at Time of Referral  ICU  Palliative Care Primary Diagnosis Sepsis/Infectious Disease  Date Notified 05/15/20  Palliative Care Type New Palliative care  Reason for referral Clarify Goals of Care  Date of Admission 05/14/20  Date first seen by Palliative Care 05/15/20  # of days Palliative referral response time 0 Day(s)  # of days IP prior to Palliative referral 1  Clinical Assessment   Palliative Performance Scale Score 60%  Psychosocial & Spiritual Assessment   Palliative Care Outcomes   Patient/Family meeting held? Yes  Who was at the meeting? Patient, son, daughter, grandson  Palliative Care Outcomes Clarified goals of care, Counseled regarding hospice      Patient Active Problem List   Diagnosis Date Noted  . Stage 4 lung cancer ([place]) 05/14/2020  . Acute on chronic respiratory failure with hypoxia and hypercapnia ([place]) 05/14/2020  . HCAP (healthcare-associated pneumonia) 05/14/2020  . Sepsis ([place]) 05/14/2020  . AKI (acute kidney injury) ([place]) 05/14/2020  . Declining functional status 05/14/2020  . Poor prognosis 05/14/2020  . Goals of care, counseling/discussion 05/14/2020  . COPD (chronic obstructive pulmonary disease) ([place]) 05/14/2020  . Elevated troponin 05/14/2020  . Pleural effusion on right 08/15/2017  . Multiple thyroid nodules 05/07/2012  . Hyperparathyroidism, primary ([place]) 06/12/2011  . Right ureteral stone 12/06/2010    Palliative Care Assessment & Plan   Patient Profile: [name] is an 87 year old male with past medical history of stage IV adenocarcinoma of the lung who is previously been treated at [facility], hypertension,  hyperparathyroidism, COPD who was admitted on 4/24 with worsening shortness of breath.  He was recently treated for pneumonia with Avelox without much improvement.  On arrival he was found to be hypoxic and placed on BiPAP.  Chest x-ray showed opacities and white blood count was elevated.  Hospitalist discussed with team at [facility] and recommendation was made for comfort.  He was found to be bacteremic and evaluated by ID who recommended trial of Unasyn (prior listed penicillin allergy but this is nothing recent and benefit thought to outweigh risk).  Family has expressed being open to consideration for home hospice palliative care consulted for goals of care.  Assessment: Shortness of breath Generalized weakness  Recommendations/Plan:  Discussed with hospital medicine and infectious disease as well as TOC.  Discussed with patient and family at bedside.  Continue current mode of care.  Home with hospice on oral antibiotics upon discharge.  Code Status:    Code Status Orders  (From admission, onward)         Start     Ordered   05/15/20 0932  Do not attempt resuscitation (DNR)  Continuous       Question Answer Comment  In the event of cardiac or respiratory ARREST Do not call a "code blue"   In the event of cardiac or respiratory ARREST Do not perform Intubation, CPR, defibrillation or ACLS   In the event of cardiac or respiratory ARREST Use medication by any route, position, wound care, and other measures to relive pain and suffering. May use oxygen, suction and manual treatment of airway obstruction as needed for comfort.      05/15/20 0931        Code Status History    Date Active Date Inactive Code Status Order ID Comments User Context   05/14/2020 1058 05/15/2020 0931 Full Code [id number]  Phillips Grout, MD ED   Advance Care Planning Activity    Advance Directive Documentation   Flowsheet Row Most Recent Value  Type of Advance  Directive Healthcare Power of Attorney  Pre-existing  out of facility DNR order (yellow form or pink MOST form) --  "MOST" Form in Place? --       Prognosis:   < 6 months  Discharge Planning:  Home with Hospice  Care plan was discussed with interdisciplinary team  Thank you for allowing the Palliative Medicine Team to assist in the care of this patient.   Time In: 1400 Time Out: 1425 Total Time 25 Prolonged Time Billed No       Greater than 50%  of this time was spent counseling and coordinating care related to the above assessment and plan.  Loistine Chance, MD  Please contact Palliative Medicine Team phone at [phone] for questions and concerns.

## 2020-05-16 NOTE — Consult Note (Signed)
Palliative care consult note  Reason for consult: Goals of care in light of bacteremia and advanced metastatic disease  Palliative care consult received.  Chart reviewed including personal review of pertinent labs and imaging.  Briefly, Alex Cherry is an 87 year old male with past medical history of stage IV adenocarcinoma of the lung who is previously been treated at [facility], hypertension, hyperparathyroidism, COPD who was admitted on 4/24 with worsening shortness of breath.  He was recently treated for pneumonia with Avelox without much improvement.  On arrival he was found to be hypoxic and placed on BiPAP.  Chest x-ray showed opacities and white blood count was elevated.  Hospitalist discussed with team at [facility] and recommendation was made for comfort.  He was found to be bacteremic and evaluated by ID who recommended trial of Unasyn (prior listed penicillin allergy but this is nothing recent and benefit thought to outweigh risk).  Family has expressed being open to consideration for home hospice palliative care consulted for goals of care.  I saw and examined Alex Cherry today.  His son, daughter, and grandson were at the bedside.  I introduced palliative care as specialized medical care for people living with serious illness. It focuses on providing relief from the symptoms and stress of a serious illness. The goal is to improve quality of life for both the patient and the family.  Alex Cherry reports that his family and his faith in the most important things to him moving forward.  He has a son, daughter, and 3 grandchildren.  His wife died several years ago.  He lives alone but his daughter, [name], and one of his grandsons, Lennette Bihari, has been living with him pretty much around-the-clock.  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  He has a strong faith and most recently has been active with a [employer] congregation.    We discussed clinical  course as well as wishes moving forward in regard to advanced directives.  Concepts specific to code status and rehospitalization discussed.  We discussed difference between a aggressive medical intervention path and a palliative, comfort focused care path.  Values and goals of care important to patient and family were attempted to be elicited.  Concept of Hospice and Palliative Care were discussed  Questions and concerns addressed.   PMT will continue to support holistically.  -DNR/DNI -Alex Cherry has incurable, progressive cancer.  He and family understand this fact and desire to ensure that he lives as well as possible for as long as possible.  He is currently admitted with bacteremia and felt much better this afternoon per his report.  He wants to continue to treat this infection, but understands that if this is related to aspiration, it may happen again in short order.  We discussed plan to continue with what ever plan ID feels is most appropriate to properly treat current bacteremia.  If regimen is one that will be covered by hospice, he may elect hospice benefits at time of discharge from the hospital.  If not, family is planning on electing his hospice benefits when the time comes that he completes treatment for this current infection. -Family would like to discuss services with Manufacturing engineer, whom his daughter has been talking with about home palliative care services. -I talked with him, family, and ICU staff about visitation.  Currently, plan is to treat his current infection but then likely transition home with hospice services.  Clinically, he appears better this  afternoon than yesterday or this morning.  After discussion with ICU staff, he is not comfort care and he is not actively dying with the plan for continued treatment of underlying infection.  Therefore, we discussed plan to continue with standard visitation and plan to, as always, reevaluate in real-time if further  exceptions should be made based upon overall situation and his clinical course. -I placed referral to transition of care would like to meet with [facility] collective to discuss home hospice services.  Start time: 1600 End time: 1720 Total time: 80 minutes Greater than 50%  of this time was spent counseling and coordinating care related to the above assessment and plan.  Micheline Rough, MD [place] Team [phone]

## 2020-05-16 NOTE — TOC Initial Note (Addendum)
Transition of Care [facility]) - Initial/Assessment Note    Patient Details  Name: Alex Cherry MRN: [id number] Date of Birth: [birth year]  Transition [facility] Care [facility]) CM/SW Contact:    Dessa Phi, RN Phone Number: 05/16/2020, 9:54 AM  Clinical Narrative:Referral for home w/hospice-spoke to dtr [name] about referral-she has already started process for home w/hospice-has an appt w/[facility] May 2-CM spoke to [place] referral-to contact donna dtr @ tel# for initial contact-then come to patient's rm for assessment-Grandson Lennette Bihari will be there tel#[phone] to put dtr donna, & son Adis on speaker phone.Has home 02-[employer]-PTAR needed @ d/c.  11:34a-awaiting final outcome of iv/po abx.DNR in shadow chart for MD signature.                  Expected Discharge Plan: Home w Hospice Care Barriers to Discharge: Continued Medical Work up   Patient Goals and CMS Choice Patient states their goals for this hospitalization and ongoing recovery are:: go home CMS Medicare.gov Compare Post Acute Care list provided to:: Patient Represenative (must comment) (dtr donna [phone]) Choice offered to / list presented to : Adult Children  Expected Discharge Plan and Services Expected Discharge Plan: Home w Hospice Care   Discharge Planning Services: CM Consult Post Acute Care Choice: Hospice Living arrangements for the past 2 months: Single Family Home                                      Prior Living Arrangements/Services Living arrangements for the past 2 months: Single Family Home Lives with:: Adult Children Patient language and need for interpreter reviewed:: Yes Do you feel safe going back to the place where you live?: Yes      Need for Family Participation in Patient Care: No (Comment) Care giver support system in place?: Yes (comment) Current home services: DME (w/c,3n1,rw,02-[facility]) Criminal Activity/Legal Involvement Pertinent to Current  Situation/Hospitalization: No - Comment as needed  Activities of Daily Living Home Assistive Devices/Equipment: Eyeglasses,Oxygen ADL Screening (condition at time of admission) Patient's cognitive ability adequate to safely complete daily activities?: Yes Is the patient deaf or have difficulty hearing?: No Does the patient have difficulty seeing, even when wearing glasses/contacts?: No Does the patient have difficulty concentrating, remembering, or making decisions?: Yes Patient able to express need for assistance with ADLs?: Yes Does the patient have difficulty dressing or bathing?: Yes Independently performs ADLs?: No Communication: Independent Dressing (OT): Dependent Is this a change from baseline?: Change from baseline, expected to last >3 days Grooming: Dependent Is this a change from baseline?: Change from baseline, expected to last >3 days Feeding: Dependent Is this a change from baseline?: Change from baseline, expected to last >3 days Bathing: Dependent Is this a change from baseline?: Change from baseline, expected to last >3 days Toileting: Dependent Is this a change from baseline?: Change from baseline, expected to last >3days In/Out Bed: Dependent Is this a change from baseline?: Change from baseline, expected to last >3 days Walks in Home: Dependent Is this a change from baseline?: Change from baseline, expected to last >3 days Does the patient have difficulty walking or climbing stairs?: Yes (secondary to shortness of breath and weakness) Weakness of Legs: Both Weakness of Arms/Hands: Both  Permission Sought/Granted Permission sought to share information with : Case Manager Permission granted to share information with : Yes, Verbal Permission Granted  Share Information with NAME: Case  Manager     Permission granted to share info w Relationship: [name] dtr [phone]     Emotional Assessment Appearance:: Appears stated age Attitude/Demeanor/Rapport:  Gracious Affect (typically observed): Accepting Orientation: : Oriented to Self,Oriented to Place,Oriented to  Time,Oriented to Situation Alcohol / Substance Use: Not Applicable Psych Involvement: No (comment)  Admission diagnosis:  Sepsis ([place]) [A41.9] Dyspnea, unspecified type [R06.00] Patient Active Problem List   Diagnosis Date Noted  . Stage 4 lung cancer ([place]) 05/14/2020  . Acute on chronic respiratory failure with hypoxia and hypercapnia ([place]) 05/14/2020  . HCAP (healthcare-associated pneumonia) 05/14/2020  . Sepsis ([place]) 05/14/2020  . AKI (acute kidney injury) ([place]) 05/14/2020  . Declining functional status 05/14/2020  . Poor prognosis 05/14/2020  . Goals of care, counseling/discussion 05/14/2020  . COPD (chronic obstructive pulmonary disease) ([place]) 05/14/2020  . Elevated troponin 05/14/2020  . Pleural effusion on right 08/15/2017  . Multiple thyroid nodules 05/07/2012  . Hyperparathyroidism, primary ([place]) 06/12/2011  . Right ureteral stone 12/06/2010   PCP:  Leonard Downing, MD Pharmacy:   [place], [place] - [address] [place] [address] Phone: [phone] Fax: [phone]     Social Determinants of Health (SDOH) Interventions    Readmission Risk Interventions No flowsheet data found.

## 2020-05-17 LAB — CBC
HCT: 34.4 % — ABNORMAL LOW (ref 39.0–52.0)
Hemoglobin: 10.2 g/dL — ABNORMAL LOW (ref 13.0–17.0)
MCH: 32.3 pg (ref 26.0–34.0)
MCHC: 29.7 g/dL — ABNORMAL LOW (ref 30.0–36.0)
MCV: 108.9 fL — ABNORMAL HIGH (ref 80.0–100.0)
Platelets: 179 10*3/uL (ref 150–400)
RBC: 3.16 MIL/uL — ABNORMAL LOW (ref 4.22–5.81)
RDW: 17.2 % — ABNORMAL HIGH (ref 11.5–15.5)
WBC: 20.8 10*3/uL — ABNORMAL HIGH (ref 4.0–10.5)
nRBC: 0 % (ref 0.0–0.2)

## 2020-05-17 LAB — BASIC METABOLIC PANEL
Anion gap: 5 (ref 5–15)
BUN: 32 mg/dL — ABNORMAL HIGH (ref 8–23)
CO2: 35 mmol/L — ABNORMAL HIGH (ref 22–32)
Calcium: 10.2 mg/dL (ref 8.9–10.3)
Chloride: 103 mmol/L (ref 98–111)
Creatinine, Ser: 1.28 mg/dL — ABNORMAL HIGH (ref 0.61–1.24)
GFR, Estimated: 55 mL/min — ABNORMAL LOW (ref >60)
Glucose, Bld: 177 mg/dL — ABNORMAL HIGH (ref 70–99)
Potassium: 3.4 mmol/L — ABNORMAL LOW (ref 3.5–5.1)
Sodium: 143 mmol/L (ref 135–145)

## 2020-05-17 LAB — CULTURE, RESPIRATORY W GRAM STAIN: Culture: NORMAL

## 2020-05-17 LAB — CULTURE, BLOOD (ROUTINE X 2)

## 2020-05-17 LAB — URINE CULTURE: Culture: 70000 — AB

## 2020-05-17 MED ORDER — AMOXICILLIN 250 MG PO CAPS
1000.0000 mg | ORAL_CAPSULE | Freq: Three times a day (TID) | ORAL | Status: DC
  Administered 2020-05-17: 1000 mg via ORAL
  Filled 2020-05-17: qty 4

## 2020-05-17 MED ORDER — AMOXICILLIN 500 MG PO CAPS: 1000.0000 mg | ORAL_CAPSULE | Freq: Three times a day (TID) | ORAL | 0 refills | Status: AC

## 2020-05-17 MED ORDER — POTASSIUM CHLORIDE CRYS ER 20 MEQ PO TBCR
40.0000 meq | EXTENDED_RELEASE_TABLET | Freq: Once | ORAL | Status: AC
  Administered 2020-05-17: 40 meq via ORAL
  Filled 2020-05-17: qty 2

## 2020-05-17 NOTE — Care Management Important Message (Signed)
Important Message  Patient Details IM Letter given to the Patient. Name: Alex Cherry MRN: [id number] Date of Birth: March 15, 1933   Medicare Important Message Given:  Yes     Kerin Salen 05/17/2020, 10:02 AM

## 2020-05-17 NOTE — TOC Transition Note (Signed)
Transition of Care [facility]) - CM/SW Discharge Note   Patient Details  Name: Alex Cherry MRN: [id number] Date of Birth: 01/04/1934  [facility]) CM/SW Contact:  Dessa Phi, RN Phone Number: 05/17/2020, 11:53 AM   Clinical Narrative: d/c home w/hospice-[facility] services-PTAR called. No further CM needs.      Final next level of care: Home w Hospice Care Barriers to Discharge: No Barriers Identified   Patient Goals and CMS Choice Patient states their goals for this hospitalization and ongoing recovery are:: go home CMS Medicare.gov Compare Post Acute Care list provided to:: Patient Represenative (must comment) (dtr donna [phone]) Choice offered to / list presented to : Adult Children  Discharge Placement                       Discharge Plan and Services   Discharge Planning Services: CM Consult Post Acute Care Choice: Hospice                               Social Determinants of Health (SDOH) Interventions     Readmission Risk Interventions No flowsheet data found.

## 2020-05-17 NOTE — Evaluation (Addendum)
Physical Therapy Evaluation Only Patient Details Name: Alex Cherry MRN: [id number] DOB: 1933/09/02 Today's Date: 05/17/2020   History of Present Illness  DVANTE HANDS is an 87 yo male presents with SOB and desaturating. PMH: arthritis, lung cancer, CKD, HTN, COPD  Clinical Impression  Pt's family present during eval, able to assist with PLOF and home set up questions. Pt requiring +2 for safety with transfers and bed mobility. Reviewed technique for transfer, educated on gait belt use and pursed lip breathing. Pt desats quickly requiring increased supplemental O2 needs (see below), educated on pursed lip breathing and SpO2 range. Pt using w/c at baseline with family propelling for pt. Pt is at baseline for mobility. All education completed and questions answered. Pt with good family support, plans to return home with family to assist. No acute PT needs identified, will sign off.    Follow Up Recommendations No PT follow up    Equipment Recommendations  None recommended by PT    Recommendations for Other Services       Precautions / Restrictions Precautions Precautions: Fall Precaution Comments: monitor O2, desats quickly Restrictions Weight Bearing Restrictions: No      Mobility  Bed Mobility Overal bed mobility: Needs Assistance Bed Mobility: Supine to Sit  Supine to sit: Mod assist;+2 for safety/equipment  General bed mobility comments: VC for sequencing and hand placement, mod A +2 for safety to upright trunk and slide BLE off of bed    Transfers Overall transfer level: Needs assistance Equipment used: 1 person hand held assist Transfers: Sit to/from [facility] Sit to Stand: Mod assist;+2 safety/equipment Stand pivot transfers: Mod assist;+2 safety/equipment  General transfer comment: mod A to power up to stand, 2nd person SUPV for safety, pivots over to chair with short small steps, VCs for pursed lip breathing and hand  placement  Ambulation/Gait  General Gait Details: pt w/c bound at baseline  Stairs            Wheelchair Mobility    Modified Rankin (Stroke Patients Only)       Balance Overall balance assessment: Needs assistance Sitting-balance support: Feet supported;Single extremity supported Sitting balance-Leahy Scale: Fair Sitting balance - Comments: seated EOB   Standing balance support: During functional activity Standing balance-Leahy Scale: Poor Standing balance comment: reliant on UE support + min A to steady with transfer        Pertinent Vitals/Pain Pain Assessment: No/denies pain    Home Living Family/patient expects to be discharged to:: Private residence Living Arrangements: Alone Available Help at Discharge: Family;Available 24 hours/day Type of Home: House Home Access: Ramped entrance     Home Layout: One level Home Equipment: Walker - 2 wheels;Bedside commode;Wheelchair - manual      Prior Function Level of Independence: Needs assistance   Gait / Transfers Assistance Needed: family assists with stand pivot transfer into w/c, family propels w/c  ADL's / 67 Assistance Needed: family assists with ADLs/IADLs  Comments: Pt on home O2, limited to 3L     Hand Dominance        Extremity/Trunk Assessment   Upper Extremity Assessment Upper Extremity Assessment: Generalized weakness    Lower Extremity Assessment Lower Extremity Assessment: Generalized weakness    Cervical / Trunk Assessment Cervical / Trunk Assessment: Kyphotic  Communication   Communication: No difficulties  Cognition Arousal/Alertness: Awake/alert Behavior During Therapy: WFL for tasks assessed/performed Overall Cognitive Status: Within Functional Limits for tasks assessed     General Comments General comments (skin integrity, edema,  etc.): Pt on 4L O2 with SpO2 92% at rest. Pt desats to 77% on 4L with supine to sit transfer increased to 7L with SpO2 90% after ~5  minutes of pursed lip breathing and seated rest. Pt desats to 81% with transfer to chair at bedside, recovers to 90% after pursed lip breathing cues and ~5 minutes rest. Pt titrated back to 4L O2 and 90% while seated up in chair at EOS- RN notified.    Exercises     Assessment/Plan    PT Assessment Patent does not need any further PT services  PT Problem List Decreased strength;Decreased activity tolerance;Decreased balance;Decreased mobility;Cardiopulmonary status limiting activity       PT Treatment Interventions      PT Goals (Current goals can be found in the Care Plan section)  Acute Rehab PT Goals Patient Stated Goal: home with family to assist PT Goal Formulation: With patient/family Time For Goal Achievement: 05/31/20 Potential to Achieve Goals: Good    Frequency     Barriers to discharge        Co-evaluation               AM-PAC PT "6 Clicks" Mobility  Outcome Measure Help needed turning from your back to your side while in a flat bed without using bedrails?: Total Help needed moving from lying on your back to sitting on the side of a flat bed without using bedrails?: Total Help needed moving to and from a bed to a chair (including a wheelchair)?: Total Help needed standing up from a chair using your arms (e.g., wheelchair or bedside chair)?: Total Help needed to walk in hospital room?: Total Help needed climbing 3-5 steps with a railing? : Total 6 Click Score: 6    End of Session Equipment Utilized During Treatment: Gait belt Activity Tolerance: Patient tolerated treatment well;Patient limited by fatigue Patient left: in chair;with call bell/phone within reach;with family/visitor present Nurse Communication: Mobility status;Other (comment) (O2) PT Visit Diagnosis: Muscle weakness (generalized) (M62.81);Adult, failure to thrive (R62.7)    Time: 5465-0354 PT Time Calculation (min) (ACUTE ONLY): 35 min   Charges:   PT Evaluation $PT Eval Moderate  Complexity: 1 Mod PT Treatments $Therapeutic Activity: 8-22 mins         Tori Elbie Statzer PT, DPT 05/17/20, 2:42 PM

## 2020-05-17 NOTE — Discharge Summary (Signed)
Physician Discharge Summary   Alex Cherry [id number]:[id number] DOB: 04/15/1933 DOA: 05/14/2020  PCP: Leonard Downing, MD  Admit date: 05/14/2020 Discharge date: 05/17/2020   Admitted From: home Disposition:  home Discharging physician: Dwyane Dee, MD  Recommendations for Outpatient Follow-up:  1. Antibiotic course continued to complete for bacteremia 2. Hospice to continue management at discharge   Patient discharged to home in Discharge Condition: stable Risk of unplanned readmission score: Unplanned Admission- Pilot do not use: 16.55  CODE STATUS: DNR Diet recommendation:  Diet Orders (From admission, onward)    Start     Ordered   05/17/20 0000  Diet - low sodium heart healthy        05/17/20 1127   05/15/20 1157  Diet Heart Room service appropriate? Yes; Fluid consistency: Thin  Diet effective now       Question Answer Comment  Room service appropriate? Yes   Fluid consistency: Thin      05/15/20 1156          Hospital Course: This is an 87 year old male with a history of stage 4 adenocarcinoma of the lung being treated at [facility], hypertension, hyperparathyroidism, COPD who presented to the ED on 4/24 with worsening shortness of breath over the past week. Patient was seen by his oncologist on 4/1 and had a GOC discussion at that time and was referred to hospice due to declining health, however the patient wished to remain Full Code and has since been on 2 L/min [place] though his requirements have increased over the past week.  He was recently treated for pneumonia with Avelox for 5 days without improvement. He was found to be hypoxic 50% on 5 L/min in the ED and placed on BiPap. CXR showed opacities and WBC was 18 on presentation. He remained full code on admission after hospitalist and PCCM had a [place] discussion. He was admitted for acute hypoxemic/hypercapnic respiratory failure and severe sepsis suspected secondary to HAP and UTI.   He was found to have Enterococcus  bacteremia on 4/25, likely UTI source and ID was consulted. Hospitalist had a discussion with Tommi Rumps, NP who is his oncology provider who recommended comfort measures and agreed to discuss with the patient's daughter over the phone. Hospitalist had a GOC discussion with the patient, daughter, son and grandson at bedside.  Plan was made to change to DNR and pursue hospice. Palliative care was consulted for further assistance. He was transferred out of the SDU as he was able to tolerate [place].  He was continued on amoxicillin at discharge to complete for his bacteremia and to continue management/care with hospice at discharge.   Principal Diagnosis: Acute on chronic respiratory failure with hypoxia and hypercapnia [facility])  Discharge Diagnoses: Active Hospital Problems   Diagnosis Date Noted  . Acute on chronic respiratory failure with hypoxia and hypercapnia ([place]) 05/14/2020  . Dyspnea   . Enterococcal bacteremia   . Stage 4 lung cancer ([place]) 05/14/2020  . HCAP (healthcare-associated pneumonia) 05/14/2020  . Sepsis ([place]) 05/14/2020  . AKI (acute kidney injury) ([place]) 05/14/2020  . Declining functional status 05/14/2020  . Poor prognosis 05/14/2020  . Goals of care, counseling/discussion 05/14/2020  . COPD (chronic obstructive pulmonary disease) ([place]) 05/14/2020  . Elevated troponin 05/14/2020    Resolved Hospital Problems  No resolved problems to display.    Discharge Instructions    Diet - low sodium heart healthy   Complete by: As directed    Increase activity slowly   Complete  by: As directed      Allergies as of 05/17/2020      Reactions   Diphenhydramine Hives, Itching, Nausea Only   All over the body   Septra [sulfamethoxazole W/trimethoprim (co-trimoxazole)] Hives, Itching, Nausea Only   All over the body   Moxifloxacin Hcl    Dizziness, weakness      Medication List    TAKE these medications   albuterol 108 (90 Base) MCG/ACT inhaler Commonly known as: VENTOLIN  HFA Inhale 2 puffs into the lungs every 6 (six) hours as needed for wheezing or shortness of breath.   amoxicillin 500 MG capsule Commonly known as: AMOXIL Take 2 capsules (1,000 mg total) by mouth 3 (three) times daily for 12 days.   aspirin EC 81 MG tablet Take 81 mg by mouth daily. Swallow whole.   diltiazem 180 MG 24 hr capsule Commonly known as: DILACOR XR Take 180 mg by mouth daily.   hydrochlorothiazide 12.5 MG capsule Commonly known as: MICROZIDE Take 12.5 mg by mouth daily.   levothyroxine 75 MCG tablet Commonly known as: SYNTHROID Take 75 mcg by mouth daily before breakfast.   ROBAFEN DM PO Take 20 mLs by mouth every 4 (four) hours.   Vitamin D (Ergocalciferol) 1.25 MG (50000 UNIT) Caps capsule Commonly known as: DRISDOL Take 50,000 Units by mouth every 7 (seven) days.       Allergies  Allergen Reactions  . Diphenhydramine Hives, Itching and Nausea Only    All over the body   . Septra [Sulfamethoxazole W/Trimethoprim (Co-Trimoxazole)] Hives, Itching and Nausea Only    All over the body  . Moxifloxacin Hcl     Dizziness, weakness    Consultations: Infectious disease Palliative care  Discharge Exam: BP (!) 151/74   Pulse 71   Temp 98.1 F (36.7 C) (Oral)   Resp 20   Ht 5\' 8"  (1.727 m)   Wt 83.7 kg   SpO2 95%   BMI 28.06 kg/m  General appearance: alert, cooperative and no distress Head: Normocephalic, without obvious abnormality, atraumatic Eyes: EOMI Lungs: scattered coarse sounds Heart: regular rate and rhythm and S1, S2 normal Abdomen: normal findings: bowel sounds normal and soft, non-tender Extremities: no edema Skin: mobility and turgor normal Neurologic: Grossly normal  The results of significant diagnostics from this hospitalization (including imaging, microbiology, ancillary and laboratory) are listed below for reference.   Microbiology: Recent Results (from the past 240 hour(s))  Resp Panel by RT-PCR (Flu A&B, Covid)  Nasopharyngeal Swab     Status: None   Collection Time: 05/14/20  8:50 AM   Specimen: Nasopharyngeal Swab; Nasopharyngeal(NP) swabs in vial transport medium  Result Value Ref Range Status   SARS Coronavirus 2 by RT PCR NEGATIVE NEGATIVE Final    Comment: (NOTE) SARS-CoV-2 target nucleic acids are NOT DETECTED.  The SARS-CoV-2 RNA is generally detectable in upper respiratory specimens during the acute phase of infection. The lowest concentration of SARS-CoV-2 viral copies this assay can detect is 138 copies/mL. A negative result does not preclude SARS-Cov-2 infection and should not be used as the sole basis for treatment or other patient management decisions. A negative result may occur with  improper specimen collection/handling, submission of specimen other than nasopharyngeal swab, presence of viral mutation(s) within the areas targeted by this assay, and inadequate number of viral copies(<138 copies/mL). A negative result must be combined with clinical observations, patient history, and epidemiological information. The expected result is Negative.  Fact Sheet for Patients:  EntrepreneurPulse.com.au  Fact Sheet  for Healthcare Providers:  IncredibleEmployment.be  This test is no t yet approved or cleared by the [place] and  has been authorized for detection and/or diagnosis of SARS-CoV-2 by FDA under an Emergency Use Authorization (EUA). This EUA will remain  in effect (meaning this test can be used) for the duration of the COVID-19 declaration under Section 564(b)(1) of the Act, 21 U.S.C.section 360bbb-3(b)(1), unless the authorization is terminated  or revoked sooner.       Influenza A by PCR NEGATIVE NEGATIVE Final   Influenza B by PCR NEGATIVE NEGATIVE Final    Comment: (NOTE) The Xpert Xpress SARS-CoV-2/FLU/RSV plus assay is intended as an aid in the diagnosis of influenza from Nasopharyngeal swab specimens and should not be  used as a sole basis for treatment. Nasal washings and aspirates are unacceptable for Xpert Xpress SARS-CoV-2/FLU/RSV testing.  Fact Sheet for Patients: EntrepreneurPulse.com.au  Fact Sheet for Healthcare Providers: IncredibleEmployment.be  This test is not yet approved or cleared by the [place] FDA and has been authorized for detection and/or diagnosis of SARS-CoV-2 by FDA under an Emergency Use Authorization (EUA). This EUA will remain in effect (meaning this test can be used) for the duration of the COVID-19 declaration under Section 564(b)(1) of the Act, 21 U.S.C. section 360bbb-3(b)(1), unless the authorization is terminated or revoked.  Performed at [facility], [place] [address] [place], [place] [address]   Culture, blood (routine x 2)     Status: Abnormal   Collection Time: 05/14/20  8:50 AM   Specimen: BLOOD  Result Value Ref Range Status   Specimen Description   Final    BLOOD RIGHT ANTECUBITAL Performed at [place] [address] [place], [place] [address]    Special Requests   Final    BOTTLES DRAWN AEROBIC AND ANAEROBIC Blood Culture results may not be optimal due to an inadequate volume of blood received in culture bottles Performed at [place] [address] [place], [place] [address]    Culture  Setup Time   Final    GRAM POSITIVE COCCI IN CHAINS IN BOTH AEROBIC AND ANAEROBIC BOTTLES CRITICAL VALUE NOTED.  VALUE IS CONSISTENT WITH PREVIOUSLY REPORTED AND CALLED VALUE.    Culture (A)  Final    ENTEROCOCCUS FAECALIS SUSCEPTIBILITIES PERFORMED ON PREVIOUS CULTURE WITHIN THE LAST 5 DAYS. Performed at [facility], [place] [address], [place], [place] [address]    Report Status 05/17/2020 FINAL  Final  Culture, blood (routine x 2)     Status: Abnormal   Collection Time: 05/14/20  8:50 AM   Specimen: BLOOD  Result Value Ref Range Status   Specimen  Description   Final    BLOOD LEFT ANTECUBITAL Performed at [facility] [address] [place], [place] [address]    Special Requests   Final    BOTTLES DRAWN AEROBIC AND ANAEROBIC Blood Culture results may not be optimal due to an inadequate volume of blood received in culture bottles Performed at [place] [address], [place], [place] [address]    Culture  Setup Time   Final    GRAM POSITIVE COCCI IN BOTH AEROBIC AND ANAEROBIC BOTTLES CRITICAL RESULT CALLED TO, READ BACK BY AND VERIFIED WITH: PHARMD M BELL [id number] AT 23 AM  BY CM Performed at [facility], [place] [address] [place], [place] [address]    Culture ENTEROCOCCUS FAECALIS (A)  Final   Report Status 05/17/2020  FINAL  Final   Organism ID, Bacteria ENTEROCOCCUS FAECALIS  Final      Susceptibility   Enterococcus faecalis - MIC*    AMPICILLIN <=2 SENSITIVE Sensitive     VANCOMYCIN 2 SENSITIVE Sensitive     GENTAMICIN SYNERGY SENSITIVE Sensitive     * ENTEROCOCCUS FAECALIS  Blood Culture ID Panel (Reflexed)     Status: Abnormal   Collection Time: 05/14/20  8:50 AM  Result Value Ref Range Status   Enterococcus faecalis DETECTED (A) NOT DETECTED Final    Comment: CRITICAL RESULT CALLED TO, READ BACK BY AND VERIFIED WITH: PHARMD M BELL [id number] AT 807 AM BY CM    Enterococcus Faecium NOT DETECTED NOT DETECTED Final   Listeria monocytogenes NOT DETECTED NOT DETECTED Final   Staphylococcus species NOT DETECTED NOT DETECTED Final   Staphylococcus aureus (BCID) NOT DETECTED NOT DETECTED Final   Staphylococcus epidermidis NOT DETECTED NOT DETECTED Final   Staphylococcus lugdunensis NOT DETECTED NOT DETECTED Final   Streptococcus species NOT DETECTED NOT DETECTED Final   Streptococcus agalactiae NOT DETECTED NOT DETECTED Final   Streptococcus pneumoniae NOT DETECTED NOT DETECTED Final   Streptococcus pyogenes NOT DETECTED NOT DETECTED Final   A.calcoaceticus-baumannii NOT  DETECTED NOT DETECTED Final   Bacteroides fragilis NOT DETECTED NOT DETECTED Final   Enterobacterales NOT DETECTED NOT DETECTED Final   Enterobacter cloacae complex NOT DETECTED NOT DETECTED Final   Escherichia coli NOT DETECTED NOT DETECTED Final   Klebsiella aerogenes NOT DETECTED NOT DETECTED Final   Klebsiella oxytoca NOT DETECTED NOT DETECTED Final   Klebsiella pneumoniae NOT DETECTED NOT DETECTED Final   Proteus species NOT DETECTED NOT DETECTED Final   Salmonella species NOT DETECTED NOT DETECTED Final   Serratia marcescens NOT DETECTED NOT DETECTED Final   Haemophilus influenzae NOT DETECTED NOT DETECTED Final   Neisseria meningitidis NOT DETECTED NOT DETECTED Final   Pseudomonas aeruginosa NOT DETECTED NOT DETECTED Final   Stenotrophomonas maltophilia NOT DETECTED NOT DETECTED Final   Candida albicans NOT DETECTED NOT DETECTED Final   Candida auris NOT DETECTED NOT DETECTED Final   Candida glabrata NOT DETECTED NOT DETECTED Final   Candida krusei NOT DETECTED NOT DETECTED Final   Candida parapsilosis NOT DETECTED NOT DETECTED Final   Candida tropicalis NOT DETECTED NOT DETECTED Final   Cryptococcus neoformans/gattii NOT DETECTED NOT DETECTED Final   Vancomycin resistance NOT DETECTED NOT DETECTED Final    Comment: Performed at [facility], [address], [place], [place] [address]  Urine culture     Status: Abnormal   Collection Time: 05/14/20 10:58 AM   Specimen: Urine, Clean Catch  Result Value Ref Range Status   Specimen Description   Final    URINE, CLEAN CATCH Performed at [facility], [place] [address], [place], [place] [address]    Special Requests   Final    NONE Performed at [facility], [place] [address] [place],  [address]    Culture 70,000 COLONIES/mL ENTEROCOCCUS FAECALIS (A)  Final   Report Status 05/17/2020 FINAL  Final   Organism ID, Bacteria ENTEROCOCCUS FAECALIS (A)  Final      Susceptibility    Enterococcus faecalis - MIC*    AMPICILLIN <=2 SENSITIVE Sensitive     NITROFURANTOIN <=16 SENSITIVE Sensitive     VANCOMYCIN 2 SENSITIVE Sensitive     * 70,000 COLONIES/mL ENTEROCOCCUS FAECALIS  MRSA PCR Screening     Status: None   Collection Time: 05/14/20 12:39 PM  Specimen: Nasopharyngeal  Result Value Ref Range Status   MRSA by PCR NEGATIVE NEGATIVE Final    Comment:        The GeneXpert MRSA Assay (FDA approved for NASAL specimens only), is one component of a comprehensive MRSA colonization surveillance program. It is not intended to diagnose MRSA infection nor to guide or monitor treatment for MRSA infections. Performed at [facility], [place] [address], [place], [place] [address]   Respiratory (~20 pathogens) panel by PCR     Status: None   Collection Time: 05/14/20  8:00 PM  Result Value Ref Range Status   Adenovirus NOT DETECTED NOT DETECTED Final   Coronavirus 229E NOT DETECTED NOT DETECTED Final    Comment: (NOTE) The Coronavirus on the Respiratory Panel, DOES NOT test for the novel  Coronavirus (2019 nCoV)    Coronavirus HKU1 NOT DETECTED NOT DETECTED Final   Coronavirus NL63 NOT DETECTED NOT DETECTED Final   Coronavirus OC43 NOT DETECTED NOT DETECTED Final   Metapneumovirus NOT DETECTED NOT DETECTED Final   Rhinovirus / Enterovirus NOT DETECTED NOT DETECTED Final   Influenza A NOT DETECTED NOT DETECTED Final   Influenza B NOT DETECTED NOT DETECTED Final   Parainfluenza Virus 1 NOT DETECTED NOT DETECTED Final   Parainfluenza Virus 2 NOT DETECTED NOT DETECTED Final   Parainfluenza Virus 3 NOT DETECTED NOT DETECTED Final   Parainfluenza Virus 4 NOT DETECTED NOT DETECTED Final   Respiratory Syncytial Virus NOT DETECTED NOT DETECTED Final   Bordetella pertussis NOT DETECTED NOT DETECTED Final   Bordetella Parapertussis NOT DETECTED NOT DETECTED Final   Chlamydophila pneumoniae NOT DETECTED NOT DETECTED Final   Mycoplasma pneumoniae NOT  DETECTED NOT DETECTED Final    Comment: Performed at [facility], [place]. [address] [place], [place] [address]  Culture, blood (Routine X 2) w Reflex to ID Panel     Status: None (Preliminary result)   Collection Time: 05/15/20 10:18 AM   Specimen: BLOOD  Result Value Ref Range Status   Specimen Description   Final    BLOOD LEFT ANTECUBITAL Performed at [place] [address], [place], [place] [address]    Special Requests   Final    BOTTLES DRAWN AEROBIC AND ANAEROBIC Blood Culture adequate volume Performed at [place] [address] [place], [place] [address]    Culture   Final    NO GROWTH 2 DAYS Performed at [place] [address] [place], [place] [address]    Report Status PENDING  Incomplete  Culture, blood (Routine X 2) w Reflex to ID Panel     Status: None (Preliminary result)   Collection Time: 05/15/20 10:19 AM   Specimen: BLOOD RIGHT HAND  Result Value Ref Range Status   Specimen Description   Final    BLOOD RIGHT HAND Performed at [place] [address], [place], [place] [address]    Special Requests   Final    BOTTLES DRAWN AEROBIC ONLY Blood Culture adequate volume Performed at [place] [address], [place], [place] [address]    Culture   Final    NO GROWTH 2 DAYS Performed at [place] [address] [place], [place] [address]    Report Status PENDING  Incomplete  Expectorated Sputum Assessment w Gram Stain, Rflx to Resp Cult     Status: None   Collection Time: 05/15/20 11:40 AM   Specimen: Expectorated  Sputum  Result Value Ref Range Status   Specimen Description EXPSU  Final   Special Requests NONE  Final   Sputum evaluation   Final    THIS SPECIMEN IS ACCEPTABLE FOR SPUTUM CULTURE Performed at [facility], [place] [address] [place], [place] [address]    Report Status 05/15/2020 FINAL  Final  Culture,  Respiratory w Gram Stain     Status: None   Collection Time: 05/15/20 11:40 AM  Result Value Ref Range Status   Specimen Description   Final    EXPSU Performed at [facility], [place] [address], [place], [place] [address]    Special Requests   Final    NONE Reflexed from [id number] Performed at [facility], [place] [address], [place], [place] [address]    Gram Stain   Final    RARE WBC PRESENT, PREDOMINANTLY PMN ABUNDANT SQUAMOUS EPITHELIAL CELLS PRESENT ABUNDANT YEAST ABUNDANT GRAM POSITIVE RODS ABUNDANT GRAM VARIABLE ROD    Culture   Final    FEW Normal respiratory flora-no Staph aureus or Pseudomonas seen Performed at [facility], [address] [place], [place] [address]    Report Status 05/17/2020 FINAL  Final     Labs: BNP (last 3 results) Recent Labs    05/14/20 0850  BNP 370.4*   Basic Metabolic Panel: Recent Labs  Lab 05/14/20 0850 05/14/20 1142 05/15/20 0751 05/16/20 0500 05/17/20 0444  NA 144  --  144 144 143  K 3.1*  --  3.8 3.3* 3.4*  CL 102  --  105 105 103  CO2 30  --  31 33* 35*  GLUCOSE 112*  --  168* 181* 177*  BUN 18  --  23 28* 32*  CREATININE 1.37* 1.39* 1.33* 1.04 1.28*  CALCIUM 10.6*  --  10.3 10.7* 10.2   Liver Function Tests: Recent Labs  Lab 05/14/20 0850 05/15/20 0751  AST 23 20  ALT 20 18  ALKPHOS 94 81  BILITOT 0.9 0.9  PROT 6.3* 5.8*  ALBUMIN 2.8* 2.4*   No results for input(s): LIPASE, AMYLASE in the last 168 hours. No results for input(s): AMMONIA in the last 168 hours. CBC: Recent Labs  Lab 05/14/20 0850 05/14/20 1142 05/15/20 0751 05/16/20 0500 05/17/20 0444  WBC 18.6* 29.7* 21.3* 22.6* 20.8*  NEUTROABS 17.9*  --   --   --   --   HGB 11.7* 10.8* 10.0* 10.7* 10.2*  HCT 39.3 35.3* 33.5* 35.1* 34.4*  MCV 107.7* 107.0* 109.1* 107.3* 108.9*  PLT 225 205 175 200 179   Cardiac Enzymes: No results for input(s): CKTOTAL, CKMB, CKMBINDEX, TROPONINI in the last 168  hours. BNP: Invalid input(s): POCBNP CBG: No results for input(s): GLUCAP in the last 168 hours. D-Dimer No results for input(s): DDIMER in the last 72 hours. Hgb A1c No results for input(s): HGBA1C in the last 72 hours. Lipid Profile No results for input(s): CHOL, HDL, LDLCALC, TRIG, CHOLHDL, LDLDIRECT in the last 72 hours. Thyroid function studies No results for input(s): TSH, T4TOTAL, T3FREE, THYROIDAB in the last 72 hours.  Invalid input(s): FREET3 Anemia work up No results for input(s): VITAMINB12, FOLATE, FERRITIN, TIBC, IRON, RETICCTPCT in the last 72 hours. Urinalysis    Component Value Date/Time   COLORURINE YELLOW 05/14/2020 0823   APPEARANCEUR HAZY (A) 05/14/2020 0823   LABSPEC 1.011 05/14/2020 0823   PHURINE 7.0 05/14/2020 0823   GLUCOSEU NEGATIVE 05/14/2020 0823   HGBUR NEGATIVE 05/14/2020 8889  BILIRUBINUR NEGATIVE 05/14/2020 [place] 05/14/2020 0823   PROTEINUR 30 (A) 05/14/2020 0823   NITRITE NEGATIVE 05/14/2020 0823   LEUKOCYTESUR LARGE (A) 05/14/2020 0823   Sepsis Labs Invalid input(s): PROCALCITONIN,  WBC,  LACTICIDVEN Microbiology Recent Results (from the past 240 hour(s))  Resp Panel by RT-PCR (Flu A&B, Covid) Nasopharyngeal Swab     Status: None   Collection Time: 05/14/20  8:50 AM   Specimen: Nasopharyngeal Swab; Nasopharyngeal(NP) swabs in vial transport medium  Result Value Ref Range Status   SARS Coronavirus 2 by RT PCR NEGATIVE NEGATIVE Final    Comment: (NOTE) SARS-CoV-2 target nucleic acids are NOT DETECTED.  The SARS-CoV-2 RNA is generally detectable in upper respiratory specimens during the acute phase of infection. The lowest concentration of SARS-CoV-2 viral copies this assay can detect is 138 copies/mL. A negative result does not preclude SARS-Cov-2 infection and should not be used as the sole basis for treatment or other patient management decisions. A negative result may occur with  improper specimen  collection/handling, submission of specimen other than nasopharyngeal swab, presence of viral mutation(s) within the areas targeted by this assay, and inadequate number of viral copies(<138 copies/mL). A negative result must be combined with clinical observations, patient history, and epidemiological information. The expected result is Negative.  Fact Sheet for Patients:  EntrepreneurPulse.com.au  Fact Sheet for Healthcare Providers:  IncredibleEmployment.be  This test is no t yet approved or cleared by the [place] FDA and  has been authorized for detection and/or diagnosis of SARS-CoV-2 by FDA under an Emergency Use Authorization (EUA). This EUA will remain  in effect (meaning this test can be used) for the duration of the COVID-19 declaration under Section 564(b)(1) of the Act, 21 U.S.C.section 360bbb-3(b)(1), unless the authorization is terminated  or revoked sooner.       Influenza A by PCR NEGATIVE NEGATIVE Final   Influenza B by PCR NEGATIVE NEGATIVE Final    Comment: (NOTE) The Xpert Xpress SARS-CoV-2/FLU/RSV plus assay is intended as an aid in the diagnosis of influenza from Nasopharyngeal swab specimens and should not be used as a sole basis for treatment. Nasal washings and aspirates are unacceptable for Xpert Xpress SARS-CoV-2/FLU/RSV testing.  Fact Sheet for Patients: EntrepreneurPulse.com.au  Fact Sheet for Healthcare Providers: IncredibleEmployment.be  This test is not yet approved or cleared by the [place] FDA and has been authorized for detection and/or diagnosis of SARS-CoV-2 by FDA under an Emergency Use Authorization (EUA). This EUA will remain in effect (meaning this test can be used) for the duration of the COVID-19 declaration under Section 564(b)(1) of the Act, 21 U.S.C. section 360bbb-3(b)(1), unless the authorization is terminated or revoked.  Performed at [facility], [place] [address], [place], [place] [address]   Culture, blood (routine x 2)     Status: Abnormal   Collection Time: 05/14/20  8:50 AM   Specimen: BLOOD  Result Value Ref Range Status   Specimen Description   Final    BLOOD RIGHT ANTECUBITAL Performed at [place] [address], [place], [place] [address]    Special Requests   Final    BOTTLES DRAWN AEROBIC AND ANAEROBIC Blood Culture results may not be optimal due to an inadequate volume of blood received in culture bottles Performed at [place] [address] [place], [place] [address]    Culture  Setup Time   Final    GRAM POSITIVE COCCI IN CHAINS IN BOTH AEROBIC  AND ANAEROBIC BOTTLES CRITICAL VALUE NOTED.  VALUE IS CONSISTENT WITH PREVIOUSLY REPORTED AND CALLED VALUE.    Culture (A)  Final    ENTEROCOCCUS FAECALIS SUSCEPTIBILITIES PERFORMED ON PREVIOUS CULTURE WITHIN THE LAST 5 DAYS. Performed at [facility], [place] [address], [place], [place] [address]    Report Status 05/17/2020 FINAL  Final  Culture, blood (routine x 2)     Status: Abnormal   Collection Time: 05/14/20  8:50 AM   Specimen: BLOOD  Result Value Ref Range Status   Specimen Description   Final    BLOOD LEFT ANTECUBITAL Performed at [facility] [address] [place], [place] [address]    Special Requests   Final    BOTTLES DRAWN AEROBIC AND ANAEROBIC Blood Culture results may not be optimal due to an inadequate volume of blood received in culture bottles Performed at [facility] [address] [place], [place] [address]    Culture  Setup Time   Final    GRAM POSITIVE COCCI IN BOTH AEROBIC AND ANAEROBIC BOTTLES CRITICAL RESULT CALLED TO, READ BACK BY AND VERIFIED WITH: PHARMD M BELL [id number] AT 15 AM  BY CM Performed at [facility], [place] [address], [place], [place] [address]    Culture ENTEROCOCCUS FAECALIS (A)  Final   Report  Status 05/17/2020 FINAL  Final   Organism ID, Bacteria ENTEROCOCCUS FAECALIS  Final      Susceptibility   Enterococcus faecalis - MIC*    AMPICILLIN <=2 SENSITIVE Sensitive     VANCOMYCIN 2 SENSITIVE Sensitive     GENTAMICIN SYNERGY SENSITIVE Sensitive     * ENTEROCOCCUS FAECALIS  Blood Culture ID Panel (Reflexed)     Status: Abnormal   Collection Time: 05/14/20  8:50 AM  Result Value Ref Range Status   Enterococcus faecalis DETECTED (A) NOT DETECTED Final    Comment: CRITICAL RESULT CALLED TO, READ BACK BY AND VERIFIED WITH: PHARMD M BELL [id number] AT 807 AM BY CM    Enterococcus Faecium NOT DETECTED NOT DETECTED Final   Listeria monocytogenes NOT DETECTED NOT DETECTED Final   Staphylococcus species NOT DETECTED NOT DETECTED Final   Staphylococcus aureus (BCID) NOT DETECTED NOT DETECTED Final   Staphylococcus epidermidis NOT DETECTED NOT DETECTED Final   Staphylococcus lugdunensis NOT DETECTED NOT DETECTED Final   Streptococcus species NOT DETECTED NOT DETECTED Final   Streptococcus agalactiae NOT DETECTED NOT DETECTED Final   Streptococcus pneumoniae NOT DETECTED NOT DETECTED Final   Streptococcus pyogenes NOT DETECTED NOT DETECTED Final   A.calcoaceticus-baumannii NOT DETECTED NOT DETECTED Final   Bacteroides fragilis NOT DETECTED NOT DETECTED Final   Enterobacterales NOT DETECTED NOT DETECTED Final   Enterobacter cloacae complex NOT DETECTED NOT DETECTED Final   Escherichia coli NOT DETECTED NOT DETECTED Final   Klebsiella aerogenes NOT DETECTED NOT DETECTED Final   Klebsiella oxytoca NOT DETECTED NOT DETECTED Final   Klebsiella pneumoniae NOT DETECTED NOT DETECTED Final   Proteus species NOT DETECTED NOT DETECTED Final   Salmonella species NOT DETECTED NOT DETECTED Final   Serratia marcescens NOT DETECTED NOT DETECTED Final   Haemophilus influenzae NOT DETECTED NOT DETECTED Final   Neisseria meningitidis NOT DETECTED NOT DETECTED Final   Pseudomonas aeruginosa NOT DETECTED NOT  DETECTED Final   Stenotrophomonas maltophilia NOT DETECTED NOT DETECTED Final   Candida albicans NOT DETECTED NOT DETECTED Final   Candida auris NOT DETECTED NOT DETECTED Final   Candida glabrata NOT DETECTED NOT DETECTED Final  Candida krusei NOT DETECTED NOT DETECTED Final   Candida parapsilosis NOT DETECTED NOT DETECTED Final   Candida tropicalis NOT DETECTED NOT DETECTED Final   Cryptococcus neoformans/gattii NOT DETECTED NOT DETECTED Final   Vancomycin resistance NOT DETECTED NOT DETECTED Final    Comment: Performed at [facility], [place] [address], [place], [place] [address]  Urine culture     Status: Abnormal   Collection Time: 05/14/20 10:58 AM   Specimen: Urine, Clean Catch  Result Value Ref Range Status   Specimen Description   Final    URINE, CLEAN CATCH Performed at [facility], [place] [address], [place], [place] [address]    Special Requests   Final    NONE Performed at [facility], [place] [address] [place], [place] [address]    Culture 70,000 COLONIES/mL ENTEROCOCCUS FAECALIS (A)  Final   Report Status 05/17/2020 FINAL  Final   Organism ID, Bacteria ENTEROCOCCUS FAECALIS (A)  Final      Susceptibility   Enterococcus faecalis - MIC*    AMPICILLIN <=2 SENSITIVE Sensitive     NITROFURANTOIN <=16 SENSITIVE Sensitive     VANCOMYCIN 2 SENSITIVE Sensitive     * 70,000 COLONIES/mL ENTEROCOCCUS FAECALIS  MRSA PCR Screening     Status: None   Collection Time: 05/14/20 12:39 PM   Specimen: Nasopharyngeal  Result Value Ref Range Status   MRSA by PCR NEGATIVE NEGATIVE Final    Comment:        The GeneXpert MRSA Assay (FDA approved for NASAL specimens only), is one component of a comprehensive MRSA colonization surveillance program. It is not intended to diagnose MRSA infection nor to guide or monitor treatment for MRSA infections. Performed at [facility], [place] [address], [place], [place] [address]    Respiratory (~20 pathogens) panel by PCR     Status: None   Collection Time: 05/14/20  8:00 PM  Result Value Ref Range Status   Adenovirus NOT DETECTED NOT DETECTED Final   Coronavirus 229E NOT DETECTED NOT DETECTED Final    Comment: (NOTE) The Coronavirus on the Respiratory Panel, DOES NOT test for the novel  Coronavirus (2019 nCoV)    Coronavirus HKU1 NOT DETECTED NOT DETECTED Final   Coronavirus NL63 NOT DETECTED NOT DETECTED Final   Coronavirus OC43 NOT DETECTED NOT DETECTED Final   Metapneumovirus NOT DETECTED NOT DETECTED Final   Rhinovirus / Enterovirus NOT DETECTED NOT DETECTED Final   Influenza A NOT DETECTED NOT DETECTED Final   Influenza B NOT DETECTED NOT DETECTED Final   Parainfluenza Virus 1 NOT DETECTED NOT DETECTED Final   Parainfluenza Virus 2 NOT DETECTED NOT DETECTED Final   Parainfluenza Virus 3 NOT DETECTED NOT DETECTED Final   Parainfluenza Virus 4 NOT DETECTED NOT DETECTED Final   Respiratory Syncytial Virus NOT DETECTED NOT DETECTED Final   Bordetella pertussis NOT DETECTED NOT DETECTED Final   Bordetella Parapertussis NOT DETECTED NOT DETECTED Final   Chlamydophila pneumoniae NOT DETECTED NOT DETECTED Final   Mycoplasma pneumoniae NOT DETECTED NOT DETECTED Final    Comment: Performed at [facility], [place]. [address], [place],  [address]  Culture, blood (Routine X 2) w Reflex to ID Panel     Status: None (Preliminary result)   Collection Time: 05/15/20 10:18 AM   Specimen: BLOOD  Result Value Ref Range Status   Specimen Description   Final    BLOOD LEFT ANTECUBITAL Performed at [facility]  [address] [place], [place] [address]    Special Requests   Final    BOTTLES DRAWN AEROBIC AND ANAEROBIC Blood Culture adequate volume Performed at [facility] [address], [place], [place] [address]    Culture   Final    NO GROWTH 2 DAYS Performed at [facility] [address] [place],  [place] [address]    Report Status PENDING  Incomplete  Culture, blood (Routine X 2) w Reflex to ID Panel     Status: None (Preliminary result)   Collection Time: 05/15/20 10:19 AM   Specimen: BLOOD RIGHT HAND  Result Value Ref Range Status   Specimen Description   Final    BLOOD RIGHT HAND Performed at [facility] [address], [place], [place] [address]    Special Requests   Final    BOTTLES DRAWN AEROBIC ONLY Blood Culture adequate volume Performed at [facility] [address] [place], [place] [address]    Culture   Final    NO GROWTH 2 DAYS Performed at [facility] [address] [place], [place] [address]    Report Status PENDING  Incomplete  Expectorated Sputum Assessment w Gram Stain, Rflx to Resp Cult     Status: None   Collection Time: 05/15/20 11:40 AM   Specimen: Expectorated Sputum  Result Value Ref Range Status   Specimen Description EXPSU  Final   Special Requests NONE  Final   Sputum evaluation   Final    THIS SPECIMEN IS ACCEPTABLE FOR SPUTUM CULTURE Performed at [facility], [place] [address], [place], [place] [address]    Report Status 05/15/2020 FINAL  Final  Culture, Respiratory w Gram Stain     Status: None   Collection Time: 05/15/20 11:40 AM  Result Value Ref Range Status   Specimen Description   Final    EXPSU Performed at [facility], [place] [address] [place], [place] [address]    Special Requests   Final    NONE Reflexed from [id number] Performed at [facility], [place] [address], [place], [place] [address]    Gram Stain   Final    RARE WBC PRESENT, PREDOMINANTLY PMN ABUNDANT SQUAMOUS EPITHELIAL CELLS PRESENT ABUNDANT YEAST ABUNDANT GRAM POSITIVE RODS ABUNDANT GRAM VARIABLE ROD    Culture   Final    FEW Normal respiratory flora-no Staph aureus or Pseudomonas seen Performed at [facility], [address], [place], [place] [address]     Report Status 05/17/2020 FINAL  Final    Procedures/Studies: CT Angio Chest PE W and/or Wo Contrast  Result Date: 05/14/2020 CLINICAL DATA:  PE suspected.  History of lung cancer. EXAM: CT ANGIOGRAPHY CHEST WITH CONTRAST TECHNIQUE: Multidetector CT imaging of the chest was performed using the standard protocol during bolus administration of intravenous contrast. Multiplanar CT image reconstructions and MIPs were obtained to evaluate the vascular anatomy. CONTRAST:  65mL OMNIPAQUE IOHEXOL 350 MG/ML SOLN COMPARISON:  08/15/2017 FINDINGS: Cardiovascular: Satisfactory opacification of the pulmonary arteries to the segmental level. No evidence of pulmonary embolism. Normal heart size. No pericardial effusion. Aortic atherosclerosis. Mediastinum/Nodes: No discrete thyroid nodule. Retained secretions are identified within the trachea. The esophagus is unremarkable. Prominent mediastinal lymph nodes are noted, none of which meet CT criteria for adenopathy. No hilar adenopathy. Lungs/Pleura: Small bilateral pleural effusions are identified, left greater right. Moderate to advanced changes of paraseptal and centrilobular emphysema with diffuse bronchial wall thickening. Bilateral  multifocal geographic areas of airspace consolidation identified along with diffuse ground-glass attenuation. In the acute setting findings are likely secondary to multifocal pneumonia. Underlying recurrent tumor in this patient who has a history of stage IV lung cancer would be difficult to exclude. Upper Abdomen: No acute abnormality. Left upper pole kidney stones measure up to 1.0 cm. There is a new left adrenal nodule measuring 1.7 x 3.7 cm, image 293/5. Posterior right hepatic lobe hypodensity measures 1.4 cm and is not significantly changed from previous exam. Musculoskeletal: Spondylosis noted throughout the thoracic spine. No acute or suspicious osseous findings. Review of the MIP images confirms the above findings. IMPRESSION: 1. No  evidence for acute pulmonary embolus. 2. Bilateral multifocal geographic areas of airspace consolidation and ground-glass attenuation are identified. In the acute setting findings are likely secondary to multifocal pneumonia. Underlying recurrent tumor in this patient who has a history of stage IV lung cancer would be difficult to exclude. Follow-up imaging is recommended to ensure resolution. In the absence of resolution tissue sampling and or PET CT may be helpful for further evaluation 3. New left adrenal nodule worrisome for metastatic disease. 4. Diffuse bronchial wall thickening with emphysema, as above; imaging findings suggestive of underlying COPD. 5. Emphysema and aortic atherosclerosis. Aortic Atherosclerosis (ICD10-I70.0) and Emphysema (ICD10-J43.9). Electronically Signed   By: Kerby Moors M.D.   On: 05/14/2020 12:38   DG Chest Port 1 View  Result Date: 05/14/2020 CLINICAL DATA:  Pt on 2.5-3L O2 continuous but has to be increased when pt gets up. Pt has lung cancer. Pt comes in on NRB. Pt was treated for lung infection 3 weeks ago but had to be taken off medication due to AMS and increased dizziness. EXAM: PORTABLE CHEST - 1 VIEW COMPARISON:  08/21/2017 FINDINGS: Interval development of extensive airspace opacities throughout both lungs, with relative sparing in the right upper lobe, and more focal consolidation in the right mid lung. There is blunting of the left lateral costophrenic angle suggesting small effusion. Heart size remains normal. No pneumothorax. Vertebral endplate spurring at multiple levels in the mid thoracic spine. IMPRESSION: Interval development of extensive bilateral airspace infiltrates or edema. Electronically Signed   By: Lucrezia Europe M.D.   On: 05/14/2020 08:52   ECHOCARDIOGRAM COMPLETE  Result Date: 05/15/2020    ECHOCARDIOGRAM REPORT   Patient Name:   TESLA BOCHICCHIO Date of Exam: 05/15/2020 Medical Rec #:  [id number]       Height:       68.0 in Accession #:     [id number]      Weight:       184.5 lb Date of Birth:  1933/08/08       BSA:          1.975 m Patient Age:    68 years        BP:           173/97 mmHg Patient Gender: M               HR:           77 bpm. Exam Location:  Inpatient Procedure: 2D Echo, Cardiac Doppler, Color Doppler and Intracardiac            Opacification Agent Indications:    Acute respiratory distress. ; R06.02 SOB  History:        Patient has prior history of Echocardiogram examinations, most  recent 08/19/2017. COPD, Signs/Symptoms:Shortness of Breath,                 Dyspnea and Dizziness/Lightheadedness; Risk                 Factors:Hypertension. Metastatic lung cancer. Hypoxia. Elevated                 troponin.  Sonographer:    Roseanna Rainbow RDCS Referring Phys: [phone] [facility]  Sonographer Comments: Technically difficult study due to poor echo windows. Patient in high fowler's position. IMPRESSIONS  1. Left ventricular ejection fraction, by estimation, is 65 to 70%. The left ventricle has normal function. The left ventricle has no regional wall motion abnormalities. There is moderate concentric left ventricular hypertrophy. Left ventricular diastolic parameters are consistent with Grade I diastolic dysfunction (impaired relaxation). Elevated left ventricular end-diastolic pressure.  2. Right ventricular systolic function is normal. The right ventricular size is normal. There is mildly elevated pulmonary artery systolic pressure.  3. The mitral valve is normal in structure. No evidence of mitral valve regurgitation. No evidence of mitral stenosis. Moderate mitral annular calcification.  4. The aortic valve is normal in structure. There is mild calcification of the aortic valve. There is mild thickening of the aortic valve. Aortic valve regurgitation is not visualized. No aortic stenosis is present.  5. The inferior vena cava is normal in size with greater than 50% respiratory variability, suggesting right atrial pressure of  3 mmHg. FINDINGS  Left Ventricle: Left ventricular ejection fraction, by estimation, is 65 to 70%. The left ventricle has normal function. The left ventricle has no regional wall motion abnormalities. Definity contrast agent was given IV to delineate the left ventricular  endocardial borders. The left ventricular internal cavity size was normal in size. There is moderate concentric left ventricular hypertrophy. Left ventricular diastolic parameters are consistent with Grade I diastolic dysfunction (impaired relaxation). Elevated left ventricular end-diastolic pressure. Right Ventricle: The right ventricular size is normal. No increase in right ventricular wall thickness. Right ventricular systolic function is normal. There is mildly elevated pulmonary artery systolic pressure. The tricuspid regurgitant velocity is 3.16  m/s, and with an assumed right atrial pressure of 3 mmHg, the estimated right ventricular systolic pressure is 99.2 mmHg. Left Atrium: Left atrial size was normal in size. Right Atrium: Right atrial size was normal in size. Pericardium: There is no evidence of pericardial effusion. Mitral Valve: The mitral valve is normal in structure. Moderate mitral annular calcification. No evidence of mitral valve regurgitation. No evidence of mitral valve stenosis. Tricuspid Valve: The tricuspid valve is normal in structure. Tricuspid valve regurgitation is trivial. No evidence of tricuspid stenosis. Aortic Valve: The aortic valve is normal in structure. There is mild calcification of the aortic valve. There is mild thickening of the aortic valve. Aortic valve regurgitation is not visualized. No aortic stenosis is present. Pulmonic Valve: The pulmonic valve was normal in structure. Pulmonic valve regurgitation is not visualized. No evidence of pulmonic stenosis. Aorta: The aortic root is normal in size and structure. Venous: The inferior vena cava is normal in size with greater than 50% respiratory variability,  suggesting right atrial pressure of 3 mmHg. IAS/Shunts: No atrial level shunt detected by color flow Doppler.  LEFT VENTRICLE PLAX 2D LVIDd:         3.30 cm     Diastology LVIDs:         1.80 cm     LV e' medial:    5.33 cm/s  LV PW:         1.80 cm     LV E/e' medial:  17.3 LV IVS:        1.60 cm     LV e' lateral:   4.57 cm/s LVOT diam:     1.70 cm     LV E/e' lateral: 20.2 LV SV:         46 LV SV Index:   23 LVOT Area:     2.27 cm  LV Volumes (MOD) LV vol d, MOD A2C: 94.2 ml LV vol d, MOD A4C: 69.2 ml LV vol s, MOD A2C: 26.9 ml LV vol s, MOD A4C: 27.0 ml LV SV MOD A2C:     67.3 ml LV SV MOD A4C:     69.2 ml LV SV MOD BP:      58.2 ml RIGHT VENTRICLE             IVC RV S prime:     14.80 cm/s  IVC diam: 2.10 cm TAPSE (M-mode): 2.4 cm LEFT ATRIUM           Index       RIGHT ATRIUM           Index LA diam:      3.00 cm 1.52 cm/m  RA Area:     11.80 cm LA Vol (A2C): 19.2 ml 9.72 ml/m  RA Volume:   24.90 ml  12.61 ml/m LA Vol (A4C): 26.4 ml 13.37 ml/m  AORTIC VALVE LVOT Vmax:   95.10 cm/s LVOT Vmean:  71.800 cm/s LVOT VTI:    0.201 m  AORTA Ao Root diam: 3.40 cm Ao Asc diam:  2.90 cm MITRAL VALVE                TRICUSPID VALVE MV Area (PHT): 3.31 cm     TR Peak grad:   39.9 mmHg MV Decel Time: 229 msec     TR Vmax:        316.00 cm/s MV E velocity: 92.40 cm/s MV A velocity: 133.00 cm/s  SHUNTS MV E/A ratio:  0.69         Systemic VTI:  0.20 m                             Systemic Diam: 1.70 cm Skeet Latch MD Electronically signed by Skeet Latch MD Signature Date/Time: 05/15/2020/3:27:44 PM    Final      Time coordinating discharge: Over 30 minutes    Dwyane Dee, MD  [facility] 05/17/2020, 6:00 PM

## 2020-05-17 NOTE — Plan of Care (Signed)

## 2020-05-17 NOTE — Progress Notes (Signed)
RN reviewed discharge packet with pt's daughter and grandson. All questions addressed. IV and tele removed. No further questions. Pt informed [facility] is on the way for transport home. Family very appreciative.

## 2020-05-17 NOTE — Progress Notes (Signed)
Subjective: He in enjoying breakfast   Antibiotics:  Anti-infectives (From admission, onward)   Start     Dose/Rate Route Frequency Ordered Stop   05/16/20 0900  levofloxacin (LEVAQUIN) IVPB 750 mg  Status:  Discontinued        750 mg 100 mL/hr over 90 Minutes Intravenous Every 48 hours 05/14/20 1142 05/14/20 1537   05/15/20 1200  vancomycin (VANCOCIN) IVPB 1000 mg/200 mL premix  Status:  Discontinued        1,000 mg 200 mL/hr over 60 Minutes Intravenous Every 24 hours 05/14/20 1143 05/15/20 1000   05/15/20 1200  Ampicillin-Sulbactam (UNASYN) 3 g in sodium chloride 0.9 % 100 mL IVPB        3 g 200 mL/hr over 30 Minutes Intravenous Every 6 hours 05/15/20 1000     05/14/20 1700  ceFEPIme (MAXIPIME) 2 g in sodium chloride 0.9 % 100 mL IVPB  Status:  Discontinued        2 g 200 mL/hr over 30 Minutes Intravenous Every 12 hours 05/14/20 1554 05/15/20 1000   05/14/20 1430  vancomycin (VANCOREADY) IVPB 750 mg/150 mL  Status:  Discontinued        750 mg 150 mL/hr over 60 Minutes Intravenous NOW 05/14/20 1337 05/14/20 1338   05/14/20 1145  vancomycin (VANCOREADY) IVPB 750 mg/150 mL        750 mg 150 mL/hr over 60 Minutes Intravenous NOW 05/14/20 1143 05/14/20 1527   05/14/20 1100  levofloxacin (LEVAQUIN) IVPB 750 mg  Status:  Discontinued        750 mg 100 mL/hr over 90 Minutes Intravenous Every 24 hours 05/14/20 1058 05/14/20 1142   05/14/20 0900  vancomycin (VANCOCIN) IVPB 1000 mg/200 mL premix        1,000 mg 200 mL/hr over 60 Minutes Intravenous  Once 05/14/20 0847 05/14/20 1007   05/14/20 0900  levofloxacin (LEVAQUIN) IVPB 750 mg        750 mg 100 mL/hr over 90 Minutes Intravenous  Once 05/14/20 0847 05/14/20 1035      Medications: Scheduled Meds: . aspirin EC  81 mg Oral Daily  . chlorhexidine  15 mL Mouth Rinse BID  . Chlorhexidine Gluconate Cloth  6 each Topical Daily  . diltiazem  180 mg Oral Daily  . enoxaparin (LOVENOX) injection  40 mg Subcutaneous Q24H  .  hydrochlorothiazide  12.5 mg Oral Daily  . levothyroxine  75 mcg Oral QAC breakfast  . mouth rinse  15 mL Mouth Rinse q12n4p  . methylPREDNISolone (SOLU-MEDROL) injection  40 mg Intravenous Q8H  . sodium chloride flush  3 mL Intravenous Q12H   Continuous Infusions: . sodium chloride 10 mL/hr at 05/17/20 0626  . ampicillin-sulbactam (UNASYN) IV 3 g (05/17/20 0631)  . dextrose 5% lactated ringers Stopped (05/17/20 0625)   PRN Meds:.sodium chloride, albuterol, lip balm, sodium chloride flush    Objective: Weight change:   Intake/Output Summary (Last 24 hours) at 05/17/2020 1056 Last data filed at 05/17/2020 0705 Gross per 24 hour  Intake 2427.43 ml  Output 1400 ml  Net 1027.43 ml   Blood pressure (!) 150/77, pulse (!) 58, temperature (!) 97.5 F (36.4 C), temperature source Oral, resp. rate 20, height 5\' 8"  (1.727 m), weight 83.7 kg, SpO2 95 %. Temp:  [97.5 F (36.4 C)-98.4 F (36.9 C)] 97.5 F (36.4 C) (04/27 0543) Pulse Rate:  [58-62] 58 (04/27 0543) Resp:  [20] 20 (04/27 0543) BP: (138-150)/(61-84) 150/77 (04/27 0543) SpO2:  [  93 %-95 %] 95 % (04/27 0543)  Physical Exam: Physical Exam Constitutional:      Appearance: He is well-developed.  HENT:     Head: Normocephalic and atraumatic.  Eyes:     Conjunctiva/sclera: Conjunctivae normal.  Cardiovascular:     Rate and Rhythm: Normal rate and regular rhythm.     Heart sounds: No murmur heard. No friction rub. No gallop.   Pulmonary:     Effort: Pulmonary effort is normal. No respiratory distress.     Breath sounds: Normal breath sounds. No stridor. No wheezing or rhonchi.  Abdominal:     General: There is no distension.     Palpations: Abdomen is soft.  Musculoskeletal:        General: Normal range of motion.     Cervical back: Normal range of motion and neck supple.  Skin:    General: Skin is warm and dry.     Findings: No erythema or rash.  Neurological:     General: No focal deficit present.     Mental  Status: He is alert and oriented to person, place, and time.  Psychiatric:        Mood and Affect: Mood normal.        Behavior: Behavior normal.        Thought Content: Thought content normal.        Judgment: Judgment normal.      CBC:    BMET Recent Labs    05/16/20 0500 05/17/20 0444  NA 144 143  K 3.3* 3.4*  CL 105 103  CO2 33* 35*  GLUCOSE 181* 177*  BUN 28* 32*  CREATININE 1.04 1.28*  CALCIUM 10.7* 10.2     Liver Panel  Recent Labs    05/15/20 0751  PROT 5.8*  ALBUMIN 2.4*  AST 20  ALT 18  ALKPHOS 81  BILITOT 0.9       Sedimentation Rate No results for input(s): ESRSEDRATE in the last 72 hours. C-Reactive Protein No results for input(s): CRP in the last 72 hours.  Micro Results: Recent Results (from the past 720 hour(s))  Resp Panel by RT-PCR (Flu A&B, Covid) Nasopharyngeal Swab     Status: None   Collection Time: 05/14/20  8:50 AM   Specimen: Nasopharyngeal Swab; Nasopharyngeal(NP) swabs in vial transport medium  Result Value Ref Range Status   SARS Coronavirus 2 by RT PCR NEGATIVE NEGATIVE Final    Comment: (NOTE) SARS-CoV-2 target nucleic acids are NOT DETECTED.  The SARS-CoV-2 RNA is generally detectable in upper respiratory specimens during the acute phase of infection. The lowest concentration of SARS-CoV-2 viral copies this assay can detect is 138 copies/mL. A negative result does not preclude SARS-Cov-2 infection and should not be used as the sole basis for treatment or other patient management decisions. A negative result may occur with  improper specimen collection/handling, submission of specimen other than nasopharyngeal swab, presence of viral mutation(s) within the areas targeted by this assay, and inadequate number of viral copies(<138 copies/mL). A negative result must be combined with clinical observations, patient history, and epidemiological information. The expected result is Negative.  Fact Sheet for Patients:   EntrepreneurPulse.com.au  Fact Sheet for Healthcare Providers:  IncredibleEmployment.be  This test is no t yet approved or cleared by the [place] FDA and  has been authorized for detection and/or diagnosis of SARS-CoV-2 by FDA under an Emergency Use Authorization (EUA). This EUA will remain  in effect (meaning this test can be used) for the  duration of the COVID-19 declaration under Section 564(b)(1) of the Act, 21 U.S.C.section 360bbb-3(b)(1), unless the authorization is terminated  or revoked sooner.       Influenza A by PCR NEGATIVE NEGATIVE Final   Influenza B by PCR NEGATIVE NEGATIVE Final    Comment: (NOTE) The Xpert Xpress SARS-CoV-2/FLU/RSV plus assay is intended as an aid in the diagnosis of influenza from Nasopharyngeal swab specimens and should not be used as a sole basis for treatment. Nasal washings and aspirates are unacceptable for Xpert Xpress SARS-CoV-2/FLU/RSV testing.  Fact Sheet for Patients: EntrepreneurPulse.com.au  Fact Sheet for Healthcare Providers: IncredibleEmployment.be  This test is not yet approved or cleared by the [place] FDA and has been authorized for detection and/or diagnosis of SARS-CoV-2 by FDA under an Emergency Use Authorization (EUA). This EUA will remain in effect (meaning this test can be used) for the duration of the COVID-19 declaration under Section 564(b)(1) of the Act, 21 U.S.C. section 360bbb-3(b)(1), unless the authorization is terminated or revoked.  Performed at [facility], [place] [address], [place], [place] [address]   Culture, blood (routine x 2)     Status: Abnormal   Collection Time: 05/14/20  8:50 AM   Specimen: BLOOD  Result Value Ref Range Status   Specimen Description   Final    BLOOD RIGHT ANTECUBITAL Performed at [place] [address], [place], [place] [address]    Special  Requests   Final    BOTTLES DRAWN AEROBIC AND ANAEROBIC Blood Culture results may not be optimal due to an inadequate volume of blood received in culture bottles Performed at [place] [address], [place], [place] [address]    Culture  Setup Time   Final    GRAM POSITIVE COCCI IN CHAINS IN BOTH AEROBIC AND ANAEROBIC BOTTLES CRITICAL VALUE NOTED.  VALUE IS CONSISTENT WITH PREVIOUSLY REPORTED AND CALLED VALUE.    Culture (A)  Final    ENTEROCOCCUS FAECALIS SUSCEPTIBILITIES PERFORMED ON PREVIOUS CULTURE WITHIN THE LAST 5 DAYS. Performed at [facility], [place] [address] [place], [place] [address]    Report Status 05/17/2020 FINAL  Final  Culture, blood (routine x 2)     Status: Abnormal   Collection Time: 05/14/20  8:50 AM   Specimen: BLOOD  Result Value Ref Range Status   Specimen Description   Final    BLOOD LEFT ANTECUBITAL Performed at [place] [address] [place], [place] [address]    Special Requests   Final    BOTTLES DRAWN AEROBIC AND ANAEROBIC Blood Culture results may not be optimal due to an inadequate volume of blood received in culture bottles Performed at [place] [address] [place], [place] [address]    Culture  Setup Time   Final    GRAM POSITIVE COCCI IN BOTH AEROBIC AND ANAEROBIC BOTTLES CRITICAL RESULT CALLED TO, READ BACK BY AND VERIFIED WITH: PHARMD M BELL [id number] AT 64 AM  BY CM Performed at [facility], [place] [address] [place], [place] [address]    Culture ENTEROCOCCUS FAECALIS (A)  Final   Report Status 05/17/2020 FINAL  Final   Organism ID, Bacteria ENTEROCOCCUS FAECALIS  Final      Susceptibility   Enterococcus faecalis - MIC*    AMPICILLIN <=2 SENSITIVE Sensitive     VANCOMYCIN 2 SENSITIVE Sensitive     GENTAMICIN SYNERGY SENSITIVE Sensitive     * ENTEROCOCCUS FAECALIS  Blood Culture ID Panel (Reflexed)     Status: Abnormal   Collection Time: 05/14/20   8:50 AM  Result Value Ref Range Status   Enterococcus faecalis DETECTED (A) NOT DETECTED Final    Comment: CRITICAL RESULT CALLED TO, READ BACK BY AND VERIFIED WITH: PHARMD M BELL [id number] AT 807 AM BY CM    Enterococcus Faecium NOT DETECTED NOT DETECTED Final   Listeria monocytogenes NOT DETECTED NOT DETECTED Final   Staphylococcus species NOT DETECTED NOT DETECTED Final   Staphylococcus aureus (BCID) NOT DETECTED NOT DETECTED Final   Staphylococcus epidermidis NOT DETECTED NOT DETECTED Final   Staphylococcus lugdunensis NOT DETECTED NOT DETECTED Final   Streptococcus species NOT DETECTED NOT DETECTED Final   Streptococcus agalactiae NOT DETECTED NOT DETECTED Final   Streptococcus pneumoniae NOT DETECTED NOT DETECTED Final   Streptococcus pyogenes NOT DETECTED NOT DETECTED Final   A.calcoaceticus-baumannii NOT DETECTED NOT DETECTED Final   Bacteroides fragilis NOT DETECTED NOT DETECTED Final   Enterobacterales NOT DETECTED NOT DETECTED Final   Enterobacter cloacae complex NOT DETECTED NOT DETECTED Final   Escherichia coli NOT DETECTED NOT DETECTED Final   Klebsiella aerogenes NOT DETECTED NOT DETECTED Final   Klebsiella oxytoca NOT DETECTED NOT DETECTED Final   Klebsiella pneumoniae NOT DETECTED NOT DETECTED Final   Proteus species NOT DETECTED NOT DETECTED Final   Salmonella species NOT DETECTED NOT DETECTED Final   Serratia marcescens NOT DETECTED NOT DETECTED Final   Haemophilus influenzae NOT DETECTED NOT DETECTED Final   Neisseria meningitidis NOT DETECTED NOT DETECTED Final   Pseudomonas aeruginosa NOT DETECTED NOT DETECTED Final   Stenotrophomonas maltophilia NOT DETECTED NOT DETECTED Final   Candida albicans NOT DETECTED NOT DETECTED Final   Candida auris NOT DETECTED NOT DETECTED Final   Candida glabrata NOT DETECTED NOT DETECTED Final   Candida krusei NOT DETECTED NOT DETECTED Final   Candida parapsilosis NOT DETECTED NOT DETECTED Final   Candida tropicalis NOT DETECTED  NOT DETECTED Final   Cryptococcus neoformans/gattii NOT DETECTED NOT DETECTED Final   Vancomycin resistance NOT DETECTED NOT DETECTED Final    Comment: Performed at [facility], [address] [place], [place] [address]  Urine culture     Status: Abnormal   Collection Time: 05/14/20 10:58 AM   Specimen: Urine, Clean Catch  Result Value Ref Range Status   Specimen Description   Final    URINE, CLEAN CATCH Performed at [facility], [place] [address] [place], [place] [address]    Special Requests   Final    NONE Performed at [facility], [place] [address], [place], [place] [address]    Culture 70,000 COLONIES/mL ENTEROCOCCUS FAECALIS (A)  Final   Report Status 05/17/2020 FINAL  Final   Organism ID, Bacteria ENTEROCOCCUS FAECALIS (A)  Final      Susceptibility   Enterococcus faecalis - MIC*    AMPICILLIN <=2 SENSITIVE Sensitive     NITROFURANTOIN <=16 SENSITIVE Sensitive     VANCOMYCIN 2 SENSITIVE Sensitive     * 70,000 COLONIES/mL ENTEROCOCCUS FAECALIS  MRSA PCR Screening     Status: None   Collection Time: 05/14/20 12:39 PM   Specimen: Nasopharyngeal  Result Value Ref Range Status   MRSA by PCR NEGATIVE NEGATIVE Final    Comment:        The GeneXpert MRSA Assay (FDA approved for NASAL specimens only), is one component of a comprehensive MRSA colonization surveillance program. It is not intended to diagnose MRSA infection  nor to guide or monitor treatment for MRSA infections. Performed at [facility], [place] [address], [place], [place] [address]   Respiratory (~20 pathogens) panel by PCR     Status: None   Collection Time: 05/14/20  8:00 PM  Result Value Ref Range Status   Adenovirus NOT DETECTED NOT DETECTED Final   Coronavirus 229E NOT DETECTED NOT DETECTED Final    Comment: (NOTE) The Coronavirus on the Respiratory Panel, DOES NOT test for the novel  Coronavirus (2019 nCoV)    Coronavirus HKU1 NOT DETECTED  NOT DETECTED Final   Coronavirus NL63 NOT DETECTED NOT DETECTED Final   Coronavirus OC43 NOT DETECTED NOT DETECTED Final   Metapneumovirus NOT DETECTED NOT DETECTED Final   Rhinovirus / Enterovirus NOT DETECTED NOT DETECTED Final   Influenza A NOT DETECTED NOT DETECTED Final   Influenza B NOT DETECTED NOT DETECTED Final   Parainfluenza Virus 1 NOT DETECTED NOT DETECTED Final   Parainfluenza Virus 2 NOT DETECTED NOT DETECTED Final   Parainfluenza Virus 3 NOT DETECTED NOT DETECTED Final   Parainfluenza Virus 4 NOT DETECTED NOT DETECTED Final   Respiratory Syncytial Virus NOT DETECTED NOT DETECTED Final   Bordetella pertussis NOT DETECTED NOT DETECTED Final   Bordetella Parapertussis NOT DETECTED NOT DETECTED Final   Chlamydophila pneumoniae NOT DETECTED NOT DETECTED Final   Mycoplasma pneumoniae NOT DETECTED NOT DETECTED Final    Comment: Performed at [facility], [place]. [address] [place], [place] [address]  Culture, blood (Routine X 2) w Reflex to ID Panel     Status: None (Preliminary result)   Collection Time: 05/15/20 10:18 AM   Specimen: BLOOD  Result Value Ref Range Status   Specimen Description   Final    BLOOD LEFT ANTECUBITAL Performed at [place] [address], [place], [place] [address]    Special Requests   Final    BOTTLES DRAWN AEROBIC AND ANAEROBIC Blood Culture adequate volume Performed at [place] [address], [place], [place] [address]    Culture   Final    NO GROWTH 2 DAYS Performed at [place] [address] [place], [place] [address]    Report Status PENDING  Incomplete  Culture, blood (Routine X 2) w Reflex to ID Panel     Status: None (Preliminary result)   Collection Time: 05/15/20 10:19 AM   Specimen: BLOOD RIGHT HAND  Result Value Ref Range Status   Specimen Description   Final    BLOOD RIGHT HAND Performed at [place] [address], [place], [place]  [address]    Special Requests   Final    BOTTLES DRAWN AEROBIC ONLY Blood Culture adequate volume Performed at [place] [address], [place], [place] [address]    Culture   Final    NO GROWTH 2 DAYS Performed at [place] [address], [place], [place] [address]    Report Status PENDING  Incomplete  Expectorated Sputum Assessment w Gram Stain, Rflx to Resp Cult     Status: None   Collection Time: 05/15/20 11:40 AM   Specimen: Expectorated Sputum  Result Value Ref Range Status   Specimen Description EXPSU  Final   Special Requests NONE  Final   Sputum evaluation   Final    THIS SPECIMEN IS ACCEPTABLE FOR SPUTUM CULTURE Performed at [facility], [place] [address], [place], [place] [address]  Report Status 05/15/2020 FINAL  Final  Culture, Respiratory w Gram Stain     Status: None (Preliminary result)   Collection Time: 05/15/20 11:40 AM  Result Value Ref Range Status   Specimen Description   Final    EXPSU Performed at [facility], [place] [address] [place], [place] [address]    Special Requests   Final    NONE Reflexed from [id number] Performed at [facility], [place] [address], [place], [place] [address]    Gram Stain   Final    RARE WBC PRESENT, PREDOMINANTLY PMN ABUNDANT SQUAMOUS EPITHELIAL CELLS PRESENT ABUNDANT YEAST ABUNDANT GRAM POSITIVE RODS ABUNDANT GRAM VARIABLE ROD    Culture   Final    FEW Normal respiratory flora-no Staph aureus or Pseudomonas seen Performed at [facility], [address] [place], [place] [address]    Report Status PENDING  Incomplete    Studies/Results: ECHOCARDIOGRAM COMPLETE  Result Date: 05/15/2020    ECHOCARDIOGRAM REPORT   Patient Name:   TRESTON COKER Date of Exam: 05/15/2020 Medical Rec #:  [id number]       Height:       68.0 in Accession #:    [id number]      Weight:       184.5 lb Date of Birth:  1933/03/25       BSA:          1.975 m  Patient Age:    87 years        BP:           173/97 mmHg Patient Gender: M               HR:           77 bpm. Exam Location:  Inpatient Procedure: 2D Echo, Cardiac Doppler, Color Doppler and Intracardiac            Opacification Agent Indications:    Acute respiratory distress. ; R06.02 SOB  History:        Patient has prior history of Echocardiogram examinations, most                 recent 08/19/2017. COPD, Signs/Symptoms:Shortness of Breath,                 Dyspnea and Dizziness/Lightheadedness; Risk                 Factors:Hypertension. Metastatic lung cancer. Hypoxia. Elevated                 troponin.  Sonographer:    Roseanna Rainbow RDCS Referring Phys: [phone] [facility]  Sonographer Comments: Technically difficult study due to poor echo windows. Patient in high fowler's position. IMPRESSIONS  1. Left ventricular ejection fraction, by estimation, is 65 to 70%. The left ventricle has normal function. The left ventricle has no regional wall motion abnormalities. There is moderate concentric left ventricular hypertrophy. Left ventricular diastolic parameters are consistent with Grade I diastolic dysfunction (impaired relaxation). Elevated left ventricular end-diastolic pressure.  2. Right ventricular systolic function is normal. The right ventricular size is normal. There is mildly elevated pulmonary artery systolic pressure.  3. The mitral valve is normal in structure. No evidence of mitral valve regurgitation. No evidence of mitral stenosis. Moderate mitral annular calcification.  4. The aortic valve is normal in structure. There is mild calcification of the aortic valve. There is mild thickening of the aortic valve. Aortic valve regurgitation is not visualized. No aortic  stenosis is present.  5. The inferior vena cava is normal in size with greater than 50% respiratory variability, suggesting right atrial pressure of 3 mmHg. FINDINGS  Left Ventricle: Left ventricular ejection fraction, by estimation, is 65 to  70%. The left ventricle has normal function. The left ventricle has no regional wall motion abnormalities. Definity contrast agent was given IV to delineate the left ventricular  endocardial borders. The left ventricular internal cavity size was normal in size. There is moderate concentric left ventricular hypertrophy. Left ventricular diastolic parameters are consistent with Grade I diastolic dysfunction (impaired relaxation). Elevated left ventricular end-diastolic pressure. Right Ventricle: The right ventricular size is normal. No increase in right ventricular wall thickness. Right ventricular systolic function is normal. There is mildly elevated pulmonary artery systolic pressure. The tricuspid regurgitant velocity is 3.16  m/s, and with an assumed right atrial pressure of 3 mmHg, the estimated right ventricular systolic pressure is 95.6 mmHg. Left Atrium: Left atrial size was normal in size. Right Atrium: Right atrial size was normal in size. Pericardium: There is no evidence of pericardial effusion. Mitral Valve: The mitral valve is normal in structure. Moderate mitral annular calcification. No evidence of mitral valve regurgitation. No evidence of mitral valve stenosis. Tricuspid Valve: The tricuspid valve is normal in structure. Tricuspid valve regurgitation is trivial. No evidence of tricuspid stenosis. Aortic Valve: The aortic valve is normal in structure. There is mild calcification of the aortic valve. There is mild thickening of the aortic valve. Aortic valve regurgitation is not visualized. No aortic stenosis is present. Pulmonic Valve: The pulmonic valve was normal in structure. Pulmonic valve regurgitation is not visualized. No evidence of pulmonic stenosis. Aorta: The aortic root is normal in size and structure. Venous: The inferior vena cava is normal in size with greater than 50% respiratory variability, suggesting right atrial pressure of 3 mmHg. IAS/Shunts: No atrial level shunt detected by  color flow Doppler.  LEFT VENTRICLE PLAX 2D LVIDd:         3.30 cm     Diastology LVIDs:         1.80 cm     LV e' medial:    5.33 cm/s LV PW:         1.80 cm     LV E/e' medial:  17.3 LV IVS:        1.60 cm     LV e' lateral:   4.57 cm/s LVOT diam:     1.70 cm     LV E/e' lateral: 20.2 LV SV:         46 LV SV Index:   23 LVOT Area:     2.27 cm  LV Volumes (MOD) LV vol d, MOD A2C: 94.2 ml LV vol d, MOD A4C: 69.2 ml LV vol s, MOD A2C: 26.9 ml LV vol s, MOD A4C: 27.0 ml LV SV MOD A2C:     67.3 ml LV SV MOD A4C:     69.2 ml LV SV MOD BP:      58.2 ml RIGHT VENTRICLE             IVC RV S prime:     14.80 cm/s  IVC diam: 2.10 cm TAPSE (M-mode): 2.4 cm LEFT ATRIUM           Index       RIGHT ATRIUM           Index LA diam:      3.00 cm 1.52 cm/m  RA Area:  11.80 cm LA Vol (A2C): 19.2 ml 9.72 ml/m  RA Volume:   24.90 ml  12.61 ml/m LA Vol (A4C): 26.4 ml 13.37 ml/m  AORTIC VALVE LVOT Vmax:   95.10 cm/s LVOT Vmean:  71.800 cm/s LVOT VTI:    0.201 m  AORTA Ao Root diam: 3.40 cm Ao Asc diam:  2.90 cm MITRAL VALVE                TRICUSPID VALVE MV Area (PHT): 3.31 cm     TR Peak grad:   39.9 mmHg MV Decel Time: 229 msec     TR Vmax:        316.00 cm/s MV E velocity: 92.40 cm/s MV A velocity: 133.00 cm/s  SHUNTS MV E/A ratio:  0.69         Systemic VTI:  0.20 m                             Systemic Diam: 1.70 cm Skeet Latch MD Electronically signed by Skeet Latch MD Signature Date/Time: 05/15/2020/3:27:44 PM    Final       Assessment/Plan:  INTERVAL HISTORY:   Patient readying for DC to home with hospice  Principal Problem:   Acute on chronic respiratory failure with hypoxia and hypercapnia (HCC) Active Problems:   Stage 4 lung cancer ([place])   HCAP (healthcare-associated pneumonia)   Sepsis ([place])   AKI (acute kidney injury) ([place])   Declining functional status   Poor prognosis   Goals of care, counseling/discussion   COPD (chronic obstructive pulmonary disease) (HCC)   Elevated troponin    Dyspnea   Enterococcal bacteremia    Alex Cherry is a 87 y.o. male with  Progressive metastatic lung cancer admitted with hypoxia and fevers and found to have enterococcal bacteremia.  We now have discovered that he DID have symptoms of dysuria prior to hosptalization  His urine cultures also DID grow E faecalis at 50k (not hig  Colony count but after he received vancomycin, levaquin  He is going to go home with hospice  Would continue unasyn in the hospital and then DC on   Amoxicillin 1000 mg TID to complete total of 2 weeks of therapy  If he and family want me to check surveillance blood cultures 2 weeks after he completes therapy he can come to my office and see me.  I do not believe hospice would draw such labs. I am certainly not pushing for this but family are anxious re risk of recurrent infection impacting his quality of life  I spent greater than 35 minutes with the patient including greater than 50% of time in face to face counsel of the patient and family  and in coordination of his care.    LOS: 3 days   Alcide Evener 05/17/2020, 10:56 AM

## 2020-05-19 ENCOUNTER — Encounter (HOSPITAL_COMMUNITY): Payer: Self-pay

## 2020-05-19 ENCOUNTER — Other Ambulatory Visit: Payer: Self-pay

## 2020-05-19 ENCOUNTER — Observation Stay (HOSPITAL_COMMUNITY)
Admission: EM | Admit: 2020-05-19 | Discharge: 2020-05-21 | DRG: 871 | Disposition: E | Attending: Family Medicine | Admitting: Family Medicine

## 2020-05-19 ENCOUNTER — Emergency Department (HOSPITAL_COMMUNITY)

## 2020-05-19 DIAGNOSIS — J9621 Acute and chronic respiratory failure with hypoxia: Secondary | ICD-10-CM | POA: Diagnosis present

## 2020-05-19 DIAGNOSIS — T68XXXA Hypothermia, initial encounter: Secondary | ICD-10-CM | POA: Diagnosis not present

## 2020-05-19 DIAGNOSIS — Z79890 Hormone replacement therapy (postmenopausal): Secondary | ICD-10-CM | POA: Diagnosis not present

## 2020-05-19 DIAGNOSIS — Z515 Encounter for palliative care: Secondary | ICD-10-CM | POA: Diagnosis not present

## 2020-05-19 DIAGNOSIS — Z7982 Long term (current) use of aspirin: Secondary | ICD-10-CM | POA: Diagnosis not present

## 2020-05-19 DIAGNOSIS — R5383 Other fatigue: Secondary | ICD-10-CM | POA: Diagnosis present

## 2020-05-19 DIAGNOSIS — E210 Primary hyperparathyroidism: Secondary | ICD-10-CM | POA: Diagnosis not present

## 2020-05-19 DIAGNOSIS — I129 Hypertensive chronic kidney disease with stage 1 through stage 4 chronic kidney disease, or unspecified chronic kidney disease: Secondary | ICD-10-CM | POA: Diagnosis present

## 2020-05-19 DIAGNOSIS — R000 Tachycardia, unspecified: Secondary | ICD-10-CM | POA: Diagnosis present

## 2020-05-19 DIAGNOSIS — R55 Syncope and collapse: Secondary | ICD-10-CM | POA: Diagnosis not present

## 2020-05-19 DIAGNOSIS — C3490 Malignant neoplasm of unspecified part of unspecified bronchus or lung: Secondary | ICD-10-CM | POA: Diagnosis present

## 2020-05-19 DIAGNOSIS — Z20822 Contact with and (suspected) exposure to covid-19: Secondary | ICD-10-CM | POA: Diagnosis present

## 2020-05-19 DIAGNOSIS — Z87891 Personal history of nicotine dependence: Secondary | ICD-10-CM | POA: Diagnosis not present

## 2020-05-19 DIAGNOSIS — J440 Chronic obstructive pulmonary disease with acute lower respiratory infection: Secondary | ICD-10-CM | POA: Diagnosis present

## 2020-05-19 DIAGNOSIS — E039 Hypothyroidism, unspecified: Secondary | ICD-10-CM | POA: Diagnosis present

## 2020-05-19 DIAGNOSIS — Z66 Do not resuscitate: Secondary | ICD-10-CM | POA: Diagnosis present

## 2020-05-19 DIAGNOSIS — Z9981 Dependence on supplemental oxygen: Secondary | ICD-10-CM

## 2020-05-19 DIAGNOSIS — H9190 Unspecified hearing loss, unspecified ear: Secondary | ICD-10-CM | POA: Diagnosis present

## 2020-05-19 DIAGNOSIS — N1831 Chronic kidney disease, stage 3a: Secondary | ICD-10-CM | POA: Diagnosis present

## 2020-05-19 DIAGNOSIS — R571 Hypovolemic shock: Secondary | ICD-10-CM | POA: Diagnosis not present

## 2020-05-19 DIAGNOSIS — I10 Essential (primary) hypertension: Secondary | ICD-10-CM | POA: Diagnosis present

## 2020-05-19 DIAGNOSIS — J189 Pneumonia, unspecified organism: Secondary | ICD-10-CM | POA: Diagnosis present

## 2020-05-19 DIAGNOSIS — A4181 Sepsis due to Enterococcus: Secondary | ICD-10-CM | POA: Diagnosis not present

## 2020-05-19 DIAGNOSIS — J449 Chronic obstructive pulmonary disease, unspecified: Secondary | ICD-10-CM | POA: Diagnosis present

## 2020-05-19 DIAGNOSIS — R579 Shock, unspecified: Secondary | ICD-10-CM

## 2020-05-19 DIAGNOSIS — M1990 Unspecified osteoarthritis, unspecified site: Secondary | ICD-10-CM | POA: Diagnosis not present

## 2020-05-19 DIAGNOSIS — E213 Hyperparathyroidism, unspecified: Secondary | ICD-10-CM | POA: Diagnosis present

## 2020-05-19 DIAGNOSIS — Z79899 Other long term (current) drug therapy: Secondary | ICD-10-CM | POA: Diagnosis not present

## 2020-05-19 DIAGNOSIS — K921 Melena: Secondary | ICD-10-CM | POA: Diagnosis not present

## 2020-05-19 DIAGNOSIS — A419 Sepsis, unspecified organism: Secondary | ICD-10-CM | POA: Diagnosis present

## 2020-05-19 DIAGNOSIS — K922 Gastrointestinal hemorrhage, unspecified: Secondary | ICD-10-CM | POA: Diagnosis present

## 2020-05-19 LAB — BLOOD GAS, VENOUS
Acid-base deficit: 15.6 mmol/L — ABNORMAL HIGH (ref 0.0–2.0)
Bicarbonate: 10.5 mmol/L — ABNORMAL LOW (ref 20.0–28.0)
O2 Saturation: 92.2 %
Patient temperature: 98.6
pCO2, Ven: 26 mmHg — ABNORMAL LOW (ref 44.0–60.0)
pH, Ven: 7.231 — ABNORMAL LOW (ref 7.250–7.430)
pO2, Ven: 66.2 mmHg — ABNORMAL HIGH (ref 32.0–45.0)

## 2020-05-19 LAB — CBG MONITORING, ED: Glucose-Capillary: 137 mg/dL — ABNORMAL HIGH (ref 70–99)

## 2020-05-19 LAB — PROTIME-INR
INR: 1.6 — ABNORMAL HIGH (ref 0.8–1.2)
Prothrombin Time: 18.7 seconds — ABNORMAL HIGH (ref 11.4–15.2)

## 2020-05-19 LAB — APTT: aPTT: 20 seconds — ABNORMAL LOW (ref 24–36)

## 2020-05-19 MED ORDER — SODIUM CHLORIDE 0.9 % IV BOLUS
500.0000 mL | Freq: Once | INTRAVENOUS | Status: AC
  Administered 2020-05-19: 500 mL via INTRAVENOUS

## 2020-05-19 MED ORDER — ALUM & MAG HYDROXIDE-SIMETH 200-200-20 MG/5ML PO SUSP
30.0000 mL | Freq: Once | ORAL | Status: AC
  Administered 2020-05-19: 30 mL via ORAL
  Filled 2020-05-19: qty 30

## 2020-05-19 MED ORDER — SODIUM CHLORIDE 0.9 % IV SOLN
2.0000 g | Freq: Once | INTRAVENOUS | Status: AC
  Administered 2020-05-19: 2 g via INTRAVENOUS
  Filled 2020-05-19: qty 2

## 2020-05-19 MED ORDER — VANCOMYCIN HCL 2000 MG/400ML IV SOLN
2000.0000 mg | Freq: Once | INTRAVENOUS | Status: AC
  Administered 2020-05-20: 2000 mg via INTRAVENOUS
  Filled 2020-05-19: qty 400

## 2020-05-19 MED ORDER — LACTATED RINGERS IV BOLUS (SEPSIS)
1500.0000 mL | Freq: Once | INTRAVENOUS | Status: AC
  Administered 2020-05-19: 1000 mL via INTRAVENOUS
  Administered 2020-05-19: 1500 mL via INTRAVENOUS

## 2020-05-19 MED ORDER — LACTATED RINGERS IV SOLN: INTRAVENOUS | Status: DC

## 2020-05-19 NOTE — Progress Notes (Signed)
A consult was received from an ED physician for Vancomycin per pharmacy dosing.  The patient's profile has been reviewed for ht/wt/allergies/indication/available labs.   A one time order has been placed for Vancomycin 2gm IV.  Further antibiotics/pharmacy consults should be ordered by admitting physician if indicated.                       Thank you, Netta Cedars PharmD 05/03/2020  11:17 PM

## 2020-05-19 NOTE — ED Notes (Signed)
Two unsuccessful attempts to draw labs.

## 2020-05-19 NOTE — ED Triage Notes (Signed)
Patient BIB [facility] for near syncopal episode. EMS stated patient was on toilet trying to have a BM and became dizzy. EMS states no report of LOC. Initially pt's HR was in 23s with first responders. EMS states once they arrived pt's HR was in 90s.  Patient was seen here 2 day sago and treated for sepsis and started on antibiotic

## 2020-05-20 ENCOUNTER — Encounter (HOSPITAL_COMMUNITY): Payer: Self-pay | Admitting: Internal Medicine

## 2020-05-20 DIAGNOSIS — A419 Sepsis, unspecified organism: Secondary | ICD-10-CM | POA: Diagnosis not present

## 2020-05-20 DIAGNOSIS — R5383 Other fatigue: Secondary | ICD-10-CM | POA: Diagnosis present

## 2020-05-20 DIAGNOSIS — E210 Primary hyperparathyroidism: Secondary | ICD-10-CM | POA: Diagnosis present

## 2020-05-20 DIAGNOSIS — C3490 Malignant neoplasm of unspecified part of unspecified bronchus or lung: Secondary | ICD-10-CM | POA: Diagnosis present

## 2020-05-20 DIAGNOSIS — Z7189 Other specified counseling: Secondary | ICD-10-CM | POA: Diagnosis not present

## 2020-05-20 DIAGNOSIS — R55 Syncope and collapse: Secondary | ICD-10-CM | POA: Diagnosis present

## 2020-05-20 DIAGNOSIS — K922 Gastrointestinal hemorrhage, unspecified: Secondary | ICD-10-CM

## 2020-05-20 DIAGNOSIS — J440 Chronic obstructive pulmonary disease with acute lower respiratory infection: Secondary | ICD-10-CM | POA: Diagnosis present

## 2020-05-20 DIAGNOSIS — Z9981 Dependence on supplemental oxygen: Secondary | ICD-10-CM | POA: Diagnosis not present

## 2020-05-20 DIAGNOSIS — R000 Tachycardia, unspecified: Secondary | ICD-10-CM | POA: Diagnosis present

## 2020-05-20 DIAGNOSIS — Z79890 Hormone replacement therapy (postmenopausal): Secondary | ICD-10-CM | POA: Diagnosis not present

## 2020-05-20 DIAGNOSIS — A4181 Sepsis due to Enterococcus: Secondary | ICD-10-CM | POA: Diagnosis present

## 2020-05-20 DIAGNOSIS — R571 Hypovolemic shock: Secondary | ICD-10-CM | POA: Diagnosis present

## 2020-05-20 DIAGNOSIS — I10 Essential (primary) hypertension: Secondary | ICD-10-CM | POA: Diagnosis present

## 2020-05-20 DIAGNOSIS — K921 Melena: Secondary | ICD-10-CM | POA: Diagnosis not present

## 2020-05-20 DIAGNOSIS — H9190 Unspecified hearing loss, unspecified ear: Secondary | ICD-10-CM | POA: Diagnosis present

## 2020-05-20 DIAGNOSIS — E039 Hypothyroidism, unspecified: Secondary | ICD-10-CM | POA: Diagnosis present

## 2020-05-20 DIAGNOSIS — Z79899 Other long term (current) drug therapy: Secondary | ICD-10-CM | POA: Diagnosis not present

## 2020-05-20 DIAGNOSIS — J189 Pneumonia, unspecified organism: Secondary | ICD-10-CM | POA: Diagnosis present

## 2020-05-20 DIAGNOSIS — Z66 Do not resuscitate: Secondary | ICD-10-CM | POA: Diagnosis present

## 2020-05-20 DIAGNOSIS — M1990 Unspecified osteoarthritis, unspecified site: Secondary | ICD-10-CM | POA: Diagnosis present

## 2020-05-20 DIAGNOSIS — Z20822 Contact with and (suspected) exposure to covid-19: Secondary | ICD-10-CM | POA: Diagnosis present

## 2020-05-20 DIAGNOSIS — Z515 Encounter for palliative care: Secondary | ICD-10-CM | POA: Diagnosis not present

## 2020-05-20 DIAGNOSIS — Z87891 Personal history of nicotine dependence: Secondary | ICD-10-CM | POA: Diagnosis not present

## 2020-05-20 DIAGNOSIS — J9621 Acute and chronic respiratory failure with hypoxia: Secondary | ICD-10-CM | POA: Diagnosis present

## 2020-05-20 DIAGNOSIS — T68XXXA Hypothermia, initial encounter: Secondary | ICD-10-CM | POA: Diagnosis present

## 2020-05-20 DIAGNOSIS — Z7982 Long term (current) use of aspirin: Secondary | ICD-10-CM | POA: Diagnosis not present

## 2020-05-20 LAB — CBC
HCT: 25.7 % — ABNORMAL LOW (ref 39.0–52.0)
HCT: 27 % — ABNORMAL LOW (ref 39.0–52.0)
HCT: 30.9 % — ABNORMAL LOW (ref 39.0–52.0)
HCT: 31.4 % — ABNORMAL LOW (ref 39.0–52.0)
Hemoglobin: 7.5 g/dL — ABNORMAL LOW (ref 13.0–17.0)
Hemoglobin: 8 g/dL — ABNORMAL LOW (ref 13.0–17.0)
Hemoglobin: 9.1 g/dL — ABNORMAL LOW (ref 13.0–17.0)
Hemoglobin: 9.3 g/dL — ABNORMAL LOW (ref 13.0–17.0)
MCH: 30.3 pg (ref 26.0–34.0)
MCH: 31.6 pg (ref 26.0–34.0)
MCH: 32.7 pg (ref 26.0–34.0)
MCH: 33.3 pg (ref 26.0–34.0)
MCHC: 29 g/dL — ABNORMAL LOW (ref 30.0–36.0)
MCHC: 29.2 g/dL — ABNORMAL LOW (ref 30.0–36.0)
MCHC: 29.6 g/dL — ABNORMAL LOW (ref 30.0–36.0)
MCHC: 30.1 g/dL (ref 30.0–36.0)
MCV: 100.7 fL — ABNORMAL HIGH (ref 80.0–100.0)
MCV: 106.7 fL — ABNORMAL HIGH (ref 80.0–100.0)
MCV: 112.9 fL — ABNORMAL HIGH (ref 80.0–100.0)
MCV: 114.2 fL — ABNORMAL HIGH (ref 80.0–100.0)
Platelets: 127 10*3/uL — ABNORMAL LOW (ref 150–400)
Platelets: 128 10*3/uL — ABNORMAL LOW (ref 150–400)
Platelets: 143 10*3/uL — ABNORMAL LOW (ref 150–400)
Platelets: 168 10*3/uL (ref 150–400)
RBC: 2.25 MIL/uL — ABNORMAL LOW (ref 4.22–5.81)
RBC: 2.53 MIL/uL — ABNORMAL LOW (ref 4.22–5.81)
RBC: 2.78 MIL/uL — ABNORMAL LOW (ref 4.22–5.81)
RBC: 3.07 MIL/uL — ABNORMAL LOW (ref 4.22–5.81)
RDW: 17.5 % — ABNORMAL HIGH (ref 11.5–15.5)
RDW: 17.8 % — ABNORMAL HIGH (ref 11.5–15.5)
RDW: 20.2 % — ABNORMAL HIGH (ref 11.5–15.5)
RDW: 25 % — ABNORMAL HIGH (ref 11.5–15.5)
WBC: 18.3 10*3/uL — ABNORMAL HIGH (ref 4.0–10.5)
WBC: 22.5 10*3/uL — ABNORMAL HIGH (ref 4.0–10.5)
WBC: 22.5 10*3/uL — ABNORMAL HIGH (ref 4.0–10.5)
WBC: 22.9 10*3/uL — ABNORMAL HIGH (ref 4.0–10.5)
nRBC: 0.2 % (ref 0.0–0.2)
nRBC: 0.3 % — ABNORMAL HIGH (ref 0.0–0.2)
nRBC: 0.3 % — ABNORMAL HIGH (ref 0.0–0.2)
nRBC: 0.3 % — ABNORMAL HIGH (ref 0.0–0.2)

## 2020-05-20 LAB — BASIC METABOLIC PANEL
Anion gap: 11 (ref 5–15)
BUN: 47 mg/dL — ABNORMAL HIGH (ref 8–23)
CO2: 26 mmol/L (ref 22–32)
Calcium: 9.7 mg/dL (ref 8.9–10.3)
Chloride: 106 mmol/L (ref 98–111)
Creatinine, Ser: 1.52 mg/dL — ABNORMAL HIGH (ref 0.61–1.24)
GFR, Estimated: 45 mL/min — ABNORMAL LOW (ref >60)
Glucose, Bld: 119 mg/dL — ABNORMAL HIGH (ref 70–99)
Potassium: 4.3 mmol/L (ref 3.5–5.1)
Sodium: 143 mmol/L (ref 135–145)

## 2020-05-20 LAB — URINALYSIS, ROUTINE W REFLEX MICROSCOPIC
Bilirubin Urine: NEGATIVE
Glucose, UA: 50 mg/dL — AB
Ketones, ur: NEGATIVE mg/dL
Nitrite: NEGATIVE
Protein, ur: 100 mg/dL — AB
Specific Gravity, Urine: 1.014 (ref 1.005–1.030)
pH: 6 (ref 5.0–8.0)

## 2020-05-20 LAB — CULTURE, BLOOD (ROUTINE X 2)
Culture: NO GROWTH
Culture: NO GROWTH
Special Requests: ADEQUATE
Special Requests: ADEQUATE

## 2020-05-20 LAB — CREATININE, SERUM
Creatinine, Ser: 1.4 mg/dL — ABNORMAL HIGH (ref 0.61–1.24)
GFR, Estimated: 50 mL/min — ABNORMAL LOW (ref >60)

## 2020-05-20 LAB — RESP PANEL BY RT-PCR (FLU A&B, COVID) ARPGX2
Influenza A by PCR: NEGATIVE
Influenza B by PCR: NEGATIVE
SARS Coronavirus 2 by RT PCR: NEGATIVE

## 2020-05-20 LAB — PROTIME-INR
INR: 1.3 — ABNORMAL HIGH (ref 0.8–1.2)
Prothrombin Time: 16.6 seconds — ABNORMAL HIGH (ref 11.4–15.2)

## 2020-05-20 LAB — ABO/RH: ABO/RH(D): O POS

## 2020-05-20 LAB — PROCALCITONIN: Procalcitonin: 3.21 ng/mL

## 2020-05-20 LAB — PREPARE RBC (CROSSMATCH)

## 2020-05-20 LAB — APTT: aPTT: 25 seconds (ref 24–36)

## 2020-05-20 LAB — LACTIC ACID, PLASMA
Lactic Acid, Venous: 3.2 mmol/L (ref 0.5–1.9)
Lactic Acid, Venous: 8 mmol/L (ref 0.5–1.9)

## 2020-05-20 MED ORDER — ACETAMINOPHEN 325 MG PO TABS: 650.0000 mg | ORAL_TABLET | Freq: Four times a day (QID) | ORAL | Status: DC | PRN

## 2020-05-20 MED ORDER — PANTOPRAZOLE SODIUM 40 MG IV SOLR
40.0000 mg | Freq: Two times a day (BID) | INTRAVENOUS | Status: DC
  Administered 2020-05-20: 40 mg via INTRAVENOUS
  Filled 2020-05-20: qty 40

## 2020-05-20 MED ORDER — SODIUM CHLORIDE 0.9% IV SOLUTION: Freq: Once | INTRAVENOUS | Status: AC

## 2020-05-20 MED ORDER — CHLORHEXIDINE GLUCONATE CLOTH 2 % EX PADS
6.0000 | MEDICATED_PAD | Freq: Every day | CUTANEOUS | Status: DC
  Administered 2020-05-20: 6 via TOPICAL

## 2020-05-20 MED ORDER — MORPHINE 100MG IN NS 100ML (1MG/ML) PREMIX INFUSION
1.0000 mg/h | INTRAVENOUS | Status: DC
  Administered 2020-05-20: 1 mg/h via INTRAVENOUS
  Filled 2020-05-20: qty 100

## 2020-05-20 MED ORDER — PIPERACILLIN-TAZOBACTAM 3.375 G IVPB
3.3750 g | Freq: Three times a day (TID) | INTRAVENOUS | Status: DC
  Administered 2020-05-20: 3.375 g via INTRAVENOUS
  Filled 2020-05-20: qty 50

## 2020-05-20 MED ORDER — ACETAMINOPHEN 650 MG RE SUPP: 650.0000 mg | Freq: Four times a day (QID) | RECTAL | Status: DC | PRN

## 2020-05-20 MED ORDER — VANCOMYCIN HCL IN DEXTROSE 1-5 GM/200ML-% IV SOLN: 1000.0000 mg | INTRAVENOUS | Status: DC

## 2020-05-20 MED ORDER — LORAZEPAM 2 MG/ML IJ SOLN: 0.5000 mg | INTRAMUSCULAR | Status: DC | PRN

## 2020-05-20 MED ORDER — LEVOTHYROXINE SODIUM 100 MCG/5ML IV SOLN: 68.5000 ug | Freq: Every day | INTRAVENOUS | Status: DC

## 2020-05-20 MED ORDER — MORPHINE SULFATE (PF) 2 MG/ML IV SOLN
1.0000 mg | INTRAVENOUS | Status: DC | PRN
  Administered 2020-05-20: 1 mg via INTRAVENOUS
  Filled 2020-05-20: qty 1

## 2020-05-20 MED ORDER — PANTOPRAZOLE SODIUM 40 MG IV SOLR
40.0000 mg | Freq: Once | INTRAVENOUS | Status: AC
  Administered 2020-05-20: 40 mg via INTRAVENOUS
  Filled 2020-05-20: qty 40

## 2020-05-21 LAB — TYPE AND SCREEN
ABO/RH(D): O POS
Antibody Screen: NEGATIVE
Unit division: 0

## 2020-05-21 LAB — URINE CULTURE: Culture: NO GROWTH

## 2020-05-21 LAB — BPAM RBC
Blood Product Expiration Date: 202205252359
ISSUE DATE / TIME: 202204300420
Unit Type and Rh: 5100

## 2020-05-21 NOTE — Progress Notes (Signed)
Pharmacy Antibiotic Note  Alex Cherry is a 87 y.o. male admitted on 05/07/2020 with sepsis.  Pharmacy has been consulted for Vancomycin & Zosyn dosing.  Admitted earlier this week with enterococcus bacteremia and discharged home 4/27 on oral amoxicillin.  Presents today after fainting spell and noted to have bloody, loose stool.   CXR also + PNA.   Scr elevated from baseline- likely from dehydration but will need to monitor closely since starting abx regimen with high risk of renal toxicity.    Plan: Zosyn 3.375g IV q8h (4 hour infusion). Vancomycin 1gm IV q24h to target AUC 400-550 Check Vancomycin levels at steady state Monitor renal function and cx data  Daily Scr while on V/Z combination    Temp (24hrs), Avg:96.3 F (35.7 C), Min:93.9 F (34.4 C), Max:97.6 F (36.4 C)  Recent Labs  Lab 05/14/20 0850 05/14/20 1142 05/15/20 0310 05/15/20 0751 05/16/20 0500 05/17/20 0444 05/04/2020 2132 04/25/2020 2332 05/07/2020 2359  WBC 18.6* 29.7*  --  21.3* 22.6* 20.8*  --   --  22.9*  CREATININE 1.37* 1.39*  --  1.33* 1.04 1.28*  --   --  1.52*  LATICACIDVEN 2.1* 1.4 1.0  --   --   --  3.2* 8.0*  --     Estimated Creatinine Clearance: 38.1 mL/min (A) (by C-G formula based on SCr of 1.52 mg/dL (H)).    Allergies  Allergen Reactions  . Diphenhydramine Hives, Itching and Nausea Only    All over the body   . Septra [Sulfamethoxazole W/Trimethoprim (Co-Trimoxazole)] Hives, Itching and Nausea Only    All over the body  . Moxifloxacin Hcl     Dizziness, weakness    Antimicrobials this admission: 4/30 Zosyn >>  4/30 Vancomcyin >>  4/29 Cefepime x 1  Dose adjustments this admission:  Microbiology results: 4/29 BCx:  4/29 UCx:   4/29 Resp PCR: negative for COVID, influenza  4/24 UCx: 70K Enterococcus faecalis (pan-sens) 4/24 BCx: Enterococcus faecalis (pan-sens)  Thank you for allowing pharmacy to be a part of this patient's care.  Netta Cedars PharmD May 23, 2020  2:11 AM

## 2020-05-21 NOTE — Progress Notes (Signed)
   Patient seen and examined at bedside, patient admitted after midnight, please see earlier detailed admission note by Rise Patience, MD. Briefly, patient presented secondary to loss of consciousness . Recently discharged home with hospice on oral antibiotics for enterococcus bacteremia. On admission, he was treated for sepsis with empiric antibiotics and IV fluids.  Subjective: Patient reports no issues.  BP (!) 133/44   Pulse 90   Temp 98.96 F (37.2 C)   Resp (!) 36   Ht 5\' 8"  (1.727 m)   Wt 84.6 kg   SpO2 92%   BMI 28.36 kg/m   General exam: Appears calm and comfortable Respiratory system: Diminished on auscultation. Respiratory effort normal. Cardiovascular system: S1 & S2 heard, RRR. No murmurs, rubs, gallops or clicks. Gastrointestinal system: Abdomen is nondistended, soft and nontender. No organomegaly or masses felt. Normal bowel sounds heard. Central nervous system: Alert and oriented. No focal neurological deficits. Musculoskeletal: Anasarca. No calf tenderness Skin: No cyanosis. No rashes Psychiatry: Judgement and insight appear normal. Mood & affect appropriate.   Brief assessment/Plan:  Principal Problem:   Sepsis ([place]) Active Problems:   Stage 4 lung cancer (HCC)   COPD (chronic obstructive pulmonary disease) (HCC)   Acute GI bleeding   DNR (do not resuscitate)   Comfort measures only status   Comfort measures Patient was admitted with sepsis present on admission with initial workup for source with empiric antibiotic treatment. While in the ED he developed bright red blood per rectum. He was admitted to the stepdown unit for fluid resuscitation, IV antibiotics and close monitoring. He required a Retail [employer] for hypothermia. Per palliatice care, during hospitalization, he developed respiratory distress and he was transitioned to full comfort measures. Palliative care has started morphine drip for pain control.  Disposition: In hospital death  Cordelia Poche, MD [facility] 05-21-20, 1:20 PM

## 2020-05-21 NOTE — Progress Notes (Signed)
Patient changed to comfort care and requested chaplain to come in and have prayer with them. Conard Novak, Chaplain   2020-06-15 1300  Clinical Encounter Type  Visited With Patient and family together  Visit Type Spiritual support  Referral From Nurse  Consult/Referral To Chaplain  Spiritual Encounters  Spiritual Needs Prayer;Emotional  Stress Factors  Patient Stress Factors Other (Comment)  Family Stress Factors Health changes

## 2020-05-21 NOTE — ED Provider Notes (Addendum)
[facility] DEPT Provider Note   CSN: [id number] Arrival date & time: 05/08/2020  2101     History Chief Complaint  Patient presents with  . Near Syncope    Alex Cherry is a 87 y.o. male.  HPI     87 year old male with history of CKD, COPD, stage IV lung cancer on chronic oxygen comes in a chief complaint of fainting spell.  Patient was just discharged from the hospital 2 days ago.  At that time he was treated for pneumonia and bacteremia.  According to the patient, he got up to go to the bathroom.  He had some dizziness.  He was told that after that he fainted in the bathroom.  Patient did not recall having any chest pain, shortness of breath prior to fainting.  He reports poor p.o. intake.  Patient states that he had 2 or 3 loose BM today.   Past Medical History:  Diagnosis Date  . Arthritis   . Asthma    as a child  . Blood in urine   . Cancer ([place])   . Chronic kidney disease    rt ureteral calculus  . Constipation   . Cough   . Difficulty urinating   . Easy bruising   . Hearing loss   . Hyperparathyroidism   . Hypertension    Denies any other heart  conditions, no chest pain or arrtthy.  . Nasal congestion   . Trouble swallowing   . Wheezing     Patient Active Problem List   Diagnosis Date Noted  . Acute GI bleeding 06/01/2020  . Dyspnea   . Enterococcal bacteremia   . Stage 4 lung cancer ([place]) 05/14/2020  . Acute on chronic respiratory failure with hypoxia and hypercapnia ([place]) 05/14/2020  . HCAP (healthcare-associated pneumonia) 05/14/2020  . Sepsis ([place]) 05/14/2020  . AKI (acute kidney injury) ([place]) 05/14/2020  . Declining functional status 05/14/2020  . Poor prognosis 05/14/2020  . Goals of care, counseling/discussion 05/14/2020  . COPD (chronic obstructive pulmonary disease) ([place]) 05/14/2020  . Elevated troponin 05/14/2020  . Pleural effusion on right 08/15/2017  . Multiple thyroid nodules 05/07/2012  .  Hyperparathyroidism, primary ([place]) 06/12/2011  . Right ureteral stone 12/06/2010    Past Surgical History:  Procedure Laterality Date  . CYSTOSCOPY W/ URETERAL STENT PLACEMENT  2009   with stone removal  . LITHOTRIPSY  2010, 2012    two procedures performed  . tumor on thumb  30 yr ago       Family History  Problem Relation Age of Onset  . Cancer Sister     Social History   Tobacco Use  . Smoking status: Former Smoker    Years: 50.00    Types: Cigarettes    Quit date: 12/03/1993    Years since quitting: 26.4  . Smokeless tobacco: Never Used  Substance Use Topics  . Alcohol use: Yes  . Drug use: No    Home Medications Prior to Admission medications   Medication Sig Start Date End Date Taking? Authorizing Provider  albuterol (PROVENTIL HFA;VENTOLIN HFA) 108 (90 Base) MCG/ACT inhaler Inhale 2 puffs into the lungs every 6 (six) hours as needed for wheezing or shortness of breath.    [provider]  amoxicillin (AMOXIL) 500 MG capsule Take 2 capsules (1,000 mg total) by mouth 3 (three) times daily for 12 days. 05/17/20 05/29/20  Dwyane Dee, MD  aspirin EC 81 MG tablet Take 81 mg by mouth daily. Swallow whole.  [provider]  Dextromethorphan-guaiFENesin (ROBAFEN DM PO) Take 20 mLs by mouth every 4 (four) hours.    [provider]  diltiazem (DILACOR XR) 180 MG 24 hr capsule Take 180 mg by mouth daily.    [provider]  hydrochlorothiazide (MICROZIDE) 12.5 MG capsule Take 12.5 mg by mouth daily. 12/22/19   [provider]  levothyroxine (SYNTHROID, LEVOTHROID) 75 MCG tablet Take 75 mcg by mouth daily before breakfast.    [provider]  Vitamin D, Ergocalciferol, (DRISDOL) 1.25 MG (50000 UNIT) CAPS capsule Take 50,000 Units by mouth every 7 (seven) days.    [provider]    Allergies    Diphenhydramine, Septra [sulfamethoxazole w/trimethoprim (co-trimoxazole)], and Moxifloxacin hcl  Review of Systems    Review of Systems  Constitutional: Positive for activity change.  Respiratory: Positive for shortness of breath.   Cardiovascular: Negative for chest pain.  Gastrointestinal: Negative for abdominal pain and vomiting.  Allergic/Immunologic: Negative for immunocompromised state.  Hematological: Does not bruise/bleed easily.  All other systems reviewed and are negative.   Physical Exam Updated Vital Signs BP (!) 110/91   Pulse 92   Temp (!) 96.6 F (35.9 C)   Resp 13   SpO2 100%   Physical Exam Vitals and nursing note reviewed.  Constitutional:      General: He is in acute distress.     Appearance: He is well-developed. He is ill-appearing. He is not diaphoretic.  HENT:     Head: Atraumatic.  Cardiovascular:     Rate and Rhythm: Tachycardia present.  Pulmonary:     Effort: Respiratory distress present.     Breath sounds: Rhonchi present.  Abdominal:     General: There is distension.     Tenderness: There is no abdominal tenderness.  Musculoskeletal:     Cervical back: Neck supple.     Right lower leg: Edema present.     Left lower leg: Edema present.  Skin:    General: Skin is warm.  Neurological:     Mental Status: He is alert and oriented to person, place, and time.     ED Results / Procedures / Treatments   Labs (all labs ordered are listed, but only abnormal results are displayed) Labs Reviewed  LACTIC ACID, PLASMA - Abnormal; Notable for the following components:      Result Value   Lactic Acid, Venous 3.2 (*)    All other components within normal limits  LACTIC ACID, PLASMA - Abnormal; Notable for the following components:   Lactic Acid, Venous 8.0 (*)    All other components within normal limits  BLOOD GAS, VENOUS - Abnormal; Notable for the following components:   pH, Ven 7.231 (*)    pCO2, Ven 26.0 (*)    pO2, Ven 66.2 (*)    Bicarbonate 10.5 (*)    Acid-base deficit 15.6 (*)    All other components within normal limits  PROTIME-INR - Abnormal;  Notable for the following components:   Prothrombin Time 18.7 (*)    INR 1.6 (*)    All other components within normal limits  APTT - Abnormal; Notable for the following components:   aPTT 20 (*)    All other components within normal limits  BASIC METABOLIC PANEL - Abnormal; Notable for the following components:   Glucose, Bld 119 (*)    BUN 47 (*)    Creatinine, Ser 1.52 (*)    GFR, Estimated 45 (*)    All other components within normal  limits  CBC - Abnormal; Notable for the following components:   WBC 22.9 (*)    RBC 2.78 (*)    Hemoglobin 9.1 (*)    HCT 31.4 (*)    MCV 112.9 (*)    MCHC 29.0 (*)    RDW 17.5 (*)    nRBC 0.3 (*)    All other components within normal limits  CBG MONITORING, ED - Abnormal; Notable for the following components:   Glucose-Capillary 137 (*)    All other components within normal limits  RESP PANEL BY RT-PCR (FLU A&B, COVID) ARPGX2  CULTURE, BLOOD (ROUTINE X 2)  CULTURE, BLOOD (ROUTINE X 2)  URINE CULTURE  URINALYSIS, ROUTINE W REFLEX MICROSCOPIC  CBC WITH DIFFERENTIAL/PLATELET  PROTIME-INR  APTT  TYPE AND SCREEN  ABO/RH    EKG EKG Interpretation  Date/Time:  Friday May 19 2020 22:01:18 EDT Ventricular Rate:  88 PR Interval:  138 QRS Duration: 94 QT Interval:  376 QTC Calculation: 454 R Axis:   53 Text Interpretation: Normal sinus rhythm Normal ECG No acute changes No significant change since last tracing Confirmed by Varney Biles ([id number]) on 05/18/2020 10:07:29 PM   Radiology DG Chest Port 1 View  Result Date: 05/13/2020 CLINICAL DATA:  Near syncope, dizziness, bradycardia, history of lung cancer EXAM: PORTABLE CHEST 1 VIEW COMPARISON:  05/14/2020 FINDINGS: Single frontal view of the chest demonstrates a stable cardiac silhouette. Multifocal bilateral airspace disease is again identified, left greater than right, without significant change since recent chest x-ray and CT. Small left pleural effusion unchanged. No pneumothorax. No  acute bony abnormalities. IMPRESSION: 1. Persistent multifocal bilateral airspace disease. This likely reflects multifocal pneumonia, though close follow-up is warranted in a patient with a history of lung cancer to document resolution and exclude underlying recurrent neoplasm. 2. Stable left pleural effusion. Electronically Signed   By: Randa Ngo M.D.   On: 05/14/2020 22:30    Procedures .Critical Care Performed by: Varney Biles, MD Authorized by: Varney Biles, MD   Critical care provider statement:    Critical care time (minutes):  110   Critical care was necessary to treat or prevent imminent or life-threatening deterioration of the following conditions:  Respiratory failure, circulatory failure and shock   Critical care was time spent personally by me on the following activities:  Discussions with consultants, evaluation of patient's response to treatment, examination of patient, ordering and performing treatments and interventions, ordering and review of laboratory studies, ordering and review of radiographic studies, pulse oximetry, re-evaluation of patient's condition, obtaining history from patient or surrogate and review of old charts     Medications Ordered in ED Medications  lactated ringers infusion ( Intravenous New Bag/Given 2020-05-22 0010)  vancomycin (VANCOREADY) IVPB 2000 mg/400 mL (2,000 mg Intravenous New Bag/Given 05/22/20 0005)  sodium chloride 0.9 % bolus 500 mL (0 mLs Intravenous Stopped 05/01/2020 2255)  alum & mag hydroxide-simeth (MAALOX/MYLANTA) 200-200-20 MG/5ML suspension 30 mL (30 mLs Oral Given 05/18/2020 2347)  lactated ringers bolus 1,500 mL (0 mLs Intravenous Stopped 05-22-2020 0122)  ceFEPIme (MAXIPIME) 2 g in sodium chloride 0.9 % 100 mL IVPB (0 g Intravenous Stopped 05-22-2020 0032)  pantoprazole (PROTONIX) injection 40 mg (40 mg Intravenous Given 05-22-20 0119)    ED Course  I have reviewed the triage vital signs and the nursing notes.  Pertinent labs &  imaging results that were available during my care of the patient were reviewed by me and considered in my medical decision making (see chart for details).  Clinical Course as of 05/24/2020 0127  Sat 24-May-2020  0125 Nursing staff told me that patient had another gross bloody stool.  I made Dr. Hal Hope, admitting doctor aware of it.  He is aware that patient's family has expressed that they would want one 2 unit of blood for supportive reasons.  I have informed him that I have not called GI doctors as patient would not want any colonoscopy.  Family has agreed to not having patient get any endoscopy or colonoscopy.  Lactic acid is rising.  Continue with supportive care only.  Repeat hemodynamic assessment completed. [AN]    Clinical Course User Index [AN] Varney Biles, MD   MDM Rules/Calculators/A&P                          87 year old comes in a chief complaint of fainting.  He has known history of advanced lung cancer, recent admission for bacteremia for which she is taken amoxicillin, COPD on oxygen.  Initial assessment reveals shock index greater than 1 with patient being hypotensive.  He appears dry.  It appears that he has had reduced p.o. intake and some diarrhea.  Suspicion is that he might have hypovolemic shock and the syncopal episode was because of orthostasis.  Patient is requiring increased oxygen however.  He had evidence of pneumonia on last x-ray and has some rhonchi.  Pulmonary edema and pneumonia are in the differential diagnosis for it.  I had a long goals of care discussion with the patient and his family.  Patient does not want to get intubated or get chest compressions.  He would also not want any major invasive procedures including no central lines.  For now, the goal is to resuscitate him with IV fluids and antibiotics.  Reassessment: After the goals of care discussion was completed with the family, nursing staff approached me and informed me that patient  had a bloody bowel movement.  He has no abdominal pain or tenderness on exam.  Patient had hematochezia on our assessment.  He is unstable for any GI intervention and likely not a good candidate for colonoscopy given very poor prognosis.  Supportive care to be continued.  Reassessment: Blood pressure is improving.  Patient is stable from a hemodynamic perspective.  I did discuss the case with ICU staff.  Given the goals of care, improved status right now, they think patient is ideal for stepdown ICU.  Hospital staff can consult them if needed.  I have put in a consult for palliative service too.  Patient is [facility] by [facility], but is not yet comfort care.  Final Clinical Impression(s) / ED Diagnoses Final diagnoses:  Syncope and collapse  Shock ([place])  Acute on chronic respiratory failure with hypoxia ([place])  Hematochezia    Rx / DC Orders ED Discharge Orders    None       Varney Biles, MD 2020/05/24 [id number]    Varney Biles, MD 05-24-20 0127

## 2020-05-21 NOTE — Consult Note (Signed)
Consultation Note Date: 06-18-20   Patient Name: Alex Cherry  DOB: Jun 06, 1933  MRN: [id number]  Age / Sex: 87 y.o., male  PCP: Leonard Downing, MD Referring Physician: Mariel Aloe, MD  Reason for Consultation: Establishing goals of care  HPI/Patient Profile: 87 y.o. male admitted on 05/13/2020     Clinical Assessment and Goals of Care: 87 year old gentleman known to palliative service from recent hospitalization, life limiting illness of stage IV adenocarcinoma of the lung, recently admitted for sepsis with enterococcal bacteremia and was discharged home with hospice on oral antibiotics, admitted with syncope possible GI bleed as well as possible developing sepsis from multifocal pneumonia.  She was initially admitted from the emergency department to stepdown unit on hospital medicine service.  Initially the plan was for time-limited trial of IV fluids and IV antibiotics.  Patient was awake and alert when I saw him initially around 9 AM today on 06/18/2020.  He was asking for shower.  He denied any difficulty breathing but appeared with severe generalized weakness and appeared chronically ill.  His daughter was present at the bedside.  Alerted by bedside RN that the patient had a change in condition, was with respiratory distress and the family was considering shifting focus to comfort measures.  Arrived at the bedside and found the patient not awake not alert.  Patient was using abdominal muscles for respiration and appeared to be in mild to moderate respiratory distress.  We quickly discussed about goals of care again, at this time efforts are to be made to avoid suffering avoid distressing symptoms at end-of-life and to focus exclusively on comfort measures.  Patient will be given IV morphine, will start morphine infusion and monitor.  HCPOA Daughter and grandsons were present at the  bedside.  SUMMARY OF RECOMMENDATIONS   DNR DNI Goals of care have now shifted to comfort measures.  Morphine low-dose continuous infusion as well as provision for boluses.  Ativan on an as-needed basis as well. Actively dying, prognosis Hours to some very limited number of days, anticipated hospital death.  Code Status/Advance Care Planning:  DNR    Symptom Management:      Palliative Prophylaxis:   Delirium Protocol  Additional Recommendations (Limitations, Scope, Preferences):  Full Comfort Care  Psycho-social/Spiritual:   Desire for further Chaplaincy support:yes  Additional Recommendations: Caregiving  Support/Resources  Prognosis:   Hours - Days  Discharge Planning: Anticipated Hospital Death      Primary Diagnoses: Present on Admission: . Acute GI bleeding . Stage 4 lung cancer ([place]) . Sepsis ([place]) . COPD (chronic obstructive pulmonary disease) ([place]) . DNR (do not resuscitate)   I have reviewed the medical record, interviewed the patient and family, and examined the patient. The following aspects are pertinent.  Past Medical History:  Diagnosis Date  . Arthritis   . Asthma    as a child  . Blood in urine   . Cancer ([place])   . Chronic kidney disease    rt ureteral calculus  . Constipation   .  Cough   . Difficulty urinating   . Easy bruising   . Hearing loss   . Hyperparathyroidism   . Hypertension    Denies any other heart  conditions, no chest pain or arrtthy.  . Nasal congestion   . Trouble swallowing   . Wheezing    Social History   Socioeconomic History  . Marital status: Widowed    Spouse name: Not on file  . Number of children: 2  . Years of education: 5  . Highest education level: Not on file  Occupational History  . Occupation: Retired   Tobacco Use  . Smoking status: Former Smoker    Years: 50.00    Types: Cigarettes    Quit date: 12/03/1993    Years since quitting: 26.4  . Smokeless tobacco: Never Used  Substance  and Sexual Activity  . Alcohol use: Yes  . Drug use: No  . Sexual activity: Not on file  Other Topics Concern  . Not on file  Social History Narrative   Patient lives at home alone.    Patient is widowed.    Patient has 2 children.    Patient has a high school education.    Patient is right handed.    Patient is retired.    Social Determinants of Health   Financial Resource Strain: Not on file  Food Insecurity: Not on file  Transportation Needs: Not on file  Physical Activity: Not on file  Stress: Not on file  Social Connections: Not on file   Family History  Problem Relation Age of Onset  . Cancer Sister    Scheduled Meds: . Chlorhexidine Gluconate Cloth  6 each Topical Q0600  . [START ON 05/22/2020] levothyroxine  68.5 mcg Intravenous Daily  . pantoprazole (PROTONIX) IV  40 mg Intravenous Q12H   Continuous Infusions: . lactated ringers 150 mL/hr at 05/29/2020 0701  . morphine    . piperacillin-tazobactam (ZOSYN)  IV 3.375 g (05-29-20 0543)  . [START ON 05/21/2020] vancomycin     PRN Meds:.acetaminophen **OR** acetaminophen, LORazepam, morphine injection Medications Prior to Admission:  Prior to Admission medications   Medication Sig Start Date End Date Taking? Authorizing Provider  albuterol (ACCUNEB) 0.63 MG/3ML nebulizer solution Inhale 3 mLs into the lungs every 6 (six) hours as needed for wheezing or shortness of breath. 05/10/20 05/10/21 Yes [provider]  albuterol (PROVENTIL HFA;VENTOLIN HFA) 108 (90 Base) MCG/ACT inhaler Inhale 2 puffs into the lungs every 6 (six) hours as needed for wheezing or shortness of breath.   Yes [provider]  amoxicillin (AMOXIL) 500 MG capsule Take 2 capsules (1,000 mg total) by mouth 3 (three) times daily for 12 days. 05/17/20 05/29/20 Yes Dwyane Dee, MD  aspirin EC 81 MG tablet Take 81 mg by mouth daily. Swallow whole.   Yes [provider]  Dextromethorphan-guaiFENesin (ROBAFEN DM PO) Take 20 mLs by mouth  every 4 (four) hours as needed (cough, congestion).   Yes [provider]  diltiazem (DILACOR XR) 180 MG 24 hr capsule Take 180 mg by mouth daily.   Yes [provider]  levothyroxine (SYNTHROID) 137 MCG tablet Take 137 mcg by mouth every morning. Before breakfast   Yes [provider]  Multiple Vitamin (MULTI-VITAMIN) tablet Take 1 tablet by mouth daily.   Yes [provider]  triamcinolone (KENALOG) 0.025 % cream Apply 1 application topically 2 (two) times daily. 02/21/20  Yes [provider]  Vitamin D, Ergocalciferol, (DRISDOL) 1.25 MG (50000 UNIT) CAPS  capsule Take 50,000 Units by mouth every 7 (seven) days.   Yes [provider]   Allergies  Allergen Reactions  . Diphenhydramine Hives, Itching and Nausea Only    All over the body   . Doxycycline Swelling  . Septra [Sulfamethoxazole W/Trimethoprim (Co-Trimoxazole)] Hives, Itching and Nausea Only    All over the body  . Moxifloxacin Hcl     Dizziness, weakness   Review of Systems Nonverbal Physical Exam Patient is not awake not alert Patient has mild peripheral cyanosis Patient has trace peripheral edema Patient has mild respiratory distress, using chest wall and abdominal muscles for respiration Abdomen is mildly distended  Vital Signs: BP (!) 133/44   Pulse 90   Temp 98.96 F (37.2 C)   Resp (!) 36   Ht 5\' 8"  (1.727 m)   Wt 84.6 kg   SpO2 92%   BMI 28.36 kg/m  Pain Scale: 0-10   Pain Score: 0-No pain   SpO2: SpO2: 92 % O2 Device:SpO2: 92 % O2 Flow Rate: .O2 Flow Rate (L/min): 4 L/min  IO: Intake/output summary:   Intake/Output Summary (Last 24 hours) at 06/03/20 1224 Last data filed at 2020/06/03 0655 Gross per 24 hour  Intake 286 ml  Output --  Net 286 ml    LBM: Last BM Date: 2020/06/03 Baseline Weight: Weight: 84.6 kg Most recent weight: Weight: 84.6 kg     Palliative Assessment/Data:   Palliative performance scale 10%.  Time In: 10 Time Out:  11 Time Total: 60 Greater than 50%  of this time was spent counseling and coordinating care related to the above assessment and plan.  Signed by: Loistine Chance, MD   Please contact Palliative Medicine Team phone at [phone] for questions and concerns.  For individual provider: See Shea Evans

## 2020-05-21 NOTE — ED Notes (Signed)
Verbal order to switch cont fluids to NS due to IV lines and incompatibilities

## 2020-05-21 NOTE — ED Notes (Signed)
ED TO INPATIENT HANDOFF REPORT  Name/Age/Gender Alex Cherry 87 y.o. male  Code Status    Code Status Orders  (From admission, onward)         Start     Ordered   05/31/20 0206  Do not attempt resuscitation (DNR)  Continuous       Question Answer Comment  In the event of cardiac or respiratory ARREST Do not call a "code blue"   In the event of cardiac or respiratory ARREST Do not perform Intubation, CPR, defibrillation or ACLS   In the event of cardiac or respiratory ARREST Use medication by any route, position, wound care, and other measures to relive pain and suffering. May use oxygen, suction and manual treatment of airway obstruction as needed for comfort.      May 31, 2020 0207        Code Status History    Date Active Date Inactive Code Status Order ID Comments User Context   05/31/2020 0030 05-31-20 0207 DNR [id number]  Varney Biles, MD ED   05/15/2020 0931 05/17/2020 1954 DNR [id number]  Harold Hedge, MD Inpatient   05/14/2020 1058 05/15/2020 0931 Full Code [id number]  Phillips Grout, MD ED   Advance Care Planning Activity    Advance Directive Documentation   Flowsheet Row Most Recent Value  Type of Advance Directive Healthcare Power of Attorney  Pre-existing out of facility DNR order (yellow form or pink MOST form) --  "MOST" Form in Place? --      Home/SNF/Other Nursing Home  Chief Complaint Acute GI bleeding [K92.2]  Level of Care/Admitting Diagnosis ED Disposition    ED Disposition Condition [place]: [facility] [[id number]]  Level of Care: Stepdown [14]  Admit to SDU based on following criteria: Severe physiological/psychological symptoms:  Any diagnosis requiring assessment & intervention at least every 4 hours on an ongoing basis to obtain desired patient outcomes including stability and rehabilitation  May admit patient to [facility] or [facility] if equivalent level of care is available:: No  Covid Evaluation:  Asymptomatic Screening Protocol (No Symptoms)  Diagnosis: Acute GI bleeding [235361]  Admitting Physician: Rise Patience [phone]  Attending Physician: Rise Patience [phone]  Estimated length of stay: past midnight tomorrow  Certification:: I certify this patient will need inpatient services for at least 2 midnights       Medical History Past Medical History:  Diagnosis Date  . Arthritis   . Asthma    as a child  . Blood in urine   . Cancer ([place])   . Chronic kidney disease    rt ureteral calculus  . Constipation   . Cough   . Difficulty urinating   . Easy bruising   . Hearing loss   . Hyperparathyroidism   . Hypertension    Denies any other heart  conditions, no chest pain or arrtthy.  . Nasal congestion   . Trouble swallowing   . Wheezing     Allergies Allergies  Allergen Reactions  . Diphenhydramine Hives, Itching and Nausea Only    All over the body   . Septra [Sulfamethoxazole W/Trimethoprim (Co-Trimoxazole)] Hives, Itching and Nausea Only    All over the body  . Moxifloxacin Hcl     Dizziness, weakness    IV Location/Drains/Wounds Patient Lines/Drains/Airways Status    Active Line/Drains/Airways    Name Placement date Placement time Site Days   Peripheral IV 04/30/2020 Left Hand 05/14/2020  2224  Hand  1   Peripheral IV 05/15/2020 Anterior;Distal;Right;Upper Arm 05/04/2020  2323  Arm  1   External Urinary Catheter 05/05/2020  2224  --  1          Labs/Imaging Results for orders placed or performed during the hospital encounter of 04/29/2020 (from the past 48 hour(s))  Urinalysis, Routine w reflex microscopic     Status: Abnormal   Collection Time: 04/26/2020  9:06 PM  Result Value Ref Range   Color, Urine YELLOW YELLOW   APPearance CLOUDY (A) CLEAR   Specific Gravity, Urine 1.014 1.005 - 1.030   pH 6.0 5.0 - 8.0   Glucose, UA 50 (A) NEGATIVE mg/dL   Hgb urine dipstick SMALL (A) NEGATIVE   Bilirubin Urine NEGATIVE NEGATIVE   Ketones, ur NEGATIVE  NEGATIVE mg/dL   Protein, ur 100 (A) NEGATIVE mg/dL   Nitrite NEGATIVE NEGATIVE   Leukocytes,Ua SMALL (A) NEGATIVE   RBC / HPF 11-20 0 - 5 RBC/hpf   WBC, UA 21-50 0 - 5 WBC/hpf   Bacteria, UA RARE (A) NONE SEEN   Squamous Epithelial / LPF 0-5 0 - 5   Mucus PRESENT    Hyaline Casts, UA PRESENT    Ca Oxalate Crys, UA PRESENT    Crystals PRESENT (A) NEGATIVE    Comment: Performed at [facility], [place] [address] [place], [place] [address]  Lactic acid, plasma     Status: Abnormal   Collection Time: 05/15/2020  9:32 PM  Result Value Ref Range   Lactic Acid, Venous 3.2 (HH) 0.5 - 1.9 mmol/L    Comment: CRITICAL RESULT CALLED TO, READ BACK BY AND VERIFIED WITH: Sydney Azure,A 06-09-2020 @0050  BY SEEL,M Performed at [facility], [place] [address], [place], [place] [address]   CBG monitoring, ED     Status: Abnormal   Collection Time: 05/09/2020 10:10 PM  Result Value Ref Range   Glucose-Capillary 137 (H) 70 - 99 mg/dL    Comment: Glucose reference range applies only to samples taken after fasting for at least 8 hours.  Blood gas, venous (at [facility] and [facility], not at [facility])     Status: Abnormal   Collection Time: 04/27/2020 10:37 PM  Result Value Ref Range   pH, Ven 7.231 (L) 7.250 - 7.430   pCO2, Ven 26.0 (L) 44.0 - 60.0 mmHg   pO2, Ven 66.2 (H) 32.0 - 45.0 mmHg   Bicarbonate 10.5 (L) 20.0 - 28.0 mmol/L   Acid-base deficit 15.6 (H) 0.0 - 2.0 mmol/L   O2 Saturation 92.2 %   Patient temperature 98.6     Comment: Performed at [facility], [place] [address] [place], [place] [address]  Resp Panel by RT-PCR (Flu A&B, Covid) Nasopharyngeal Swab     Status: None   Collection Time: 05/02/2020 11:02 PM   Specimen: Nasopharyngeal Swab; Nasopharyngeal(NP) swabs in vial transport medium  Result Value Ref Range   SARS Coronavirus 2 by RT PCR NEGATIVE NEGATIVE    Comment: (NOTE) SARS-CoV-2 target nucleic acids are NOT DETECTED.  The SARS-CoV-2 RNA is generally  detectable in upper respiratory specimens during the acute phase of infection. The lowest concentration of SARS-CoV-2 viral copies this assay can detect is 138 copies/mL. A negative result does not preclude SARS-Cov-2 infection and should not be used as the sole basis for treatment or other patient management decisions. A negative result may occur with  improper specimen collection/handling, submission of specimen other than nasopharyngeal swab, presence of viral mutation(s) within  the areas targeted by this assay, and inadequate number of viral copies(<138 copies/mL). A negative result must be combined with clinical observations, patient history, and epidemiological information. The expected result is Negative.  Fact Sheet for Patients:  EntrepreneurPulse.com.au  Fact Sheet for Healthcare Providers:  IncredibleEmployment.be  This test is no t yet approved or cleared by the [place] FDA and  has been authorized for detection and/or diagnosis of SARS-CoV-2 by FDA under an Emergency Use Authorization (EUA). This EUA will remain  in effect (meaning this test can be used) for the duration of the COVID-19 declaration under Section 564(b)(1) of the Act, 21 U.S.C.section 360bbb-3(b)(1), unless the authorization is terminated  or revoked sooner.       Influenza A by PCR NEGATIVE NEGATIVE   Influenza B by PCR NEGATIVE NEGATIVE    Comment: (NOTE) The Xpert Xpress SARS-CoV-2/FLU/RSV plus assay is intended as an aid in the diagnosis of influenza from Nasopharyngeal swab specimens and should not be used as a sole basis for treatment. Nasal washings and aspirates are unacceptable for Xpert Xpress SARS-CoV-2/FLU/RSV testing.  Fact Sheet for Patients: EntrepreneurPulse.com.au  Fact Sheet for Healthcare Providers: IncredibleEmployment.be  This test is not yet approved or cleared by the [place] FDA and has  been authorized for detection and/or diagnosis of SARS-CoV-2 by FDA under an Emergency Use Authorization (EUA). This EUA will remain in effect (meaning this test can be used) for the duration of the COVID-19 declaration under Section 564(b)(1) of the Act, 21 U.S.C. section 360bbb-3(b)(1), unless the authorization is terminated or revoked.  Performed at [facility], [place] [address], [place], [place] [address]   Protime-INR     Status: Abnormal   Collection Time: 05/05/2020 11:02 PM  Result Value Ref Range   Prothrombin Time 18.7 (H) 11.4 - 15.2 seconds   INR 1.6 (H) 0.8 - 1.2    Comment: (NOTE) INR goal varies based on device and disease states. Performed at [facility], [place] [address] [place], [place] [address]   APTT     Status: Abnormal   Collection Time: 05/18/2020 11:02 PM  Result Value Ref Range   aPTT 20 (L) 24 - 36 seconds    Comment: Performed at [facility], [place] [address] [place], [place] [address]  Type and screen     Status: None (Preliminary result)   Collection Time: 05/13/2020 11:04 PM  Result Value Ref Range   ABO/RH(D) O POS    Antibody Screen NEG    Sample Expiration 05/22/2020,2359    Unit Number [id number]    Blood Component Type RBC LR PHER2    Unit division 00    Status of Unit ISSUED    Transfusion Status OK TO TRANSFUSE    Crossmatch Result      Compatible Performed at [place] [address] [place], [place] [address]   Lactic acid, plasma     Status: Abnormal   Collection Time: 05/18/2020 11:32 PM  Result Value Ref Range   Lactic Acid, Venous 8.0 (HH) 0.5 - 1.9 mmol/L    Comment: CRITICAL RESULT CALLED TO, READ BACK BY AND VERIFIED WITH: Maizie Garno,A 2020-06-08 @0050  BY SEEL,M Performed at [facility], [place] [address] [place], [place] [address]   Basic metabolic panel     Status: Abnormal   Collection Time: 05/15/2020 11:59 PM  Result Value Ref  Range   Sodium 143 135 - 145 mmol/L   Potassium 4.3 3.5 -  5.1 mmol/L   Chloride 106 98 - 111 mmol/L   CO2 26 22 - 32 mmol/L   Glucose, Bld 119 (H) 70 - 99 mg/dL    Comment: Glucose reference range applies only to samples taken after fasting for at least 8 hours.   BUN 47 (H) 8 - 23 mg/dL   Creatinine, Ser 1.52 (H) 0.61 - 1.24 mg/dL   Calcium 9.7 8.9 - 10.3 mg/dL   GFR, Estimated 45 (L) >60 mL/min    Comment: (NOTE) Calculated using the CKD-EPI Creatinine Equation (2021)    Anion gap 11 5 - 15    Comment: Performed at [facility], [place] [address], [place], [place] [address]  CBC     Status: Abnormal   Collection Time: 04/21/2020 11:59 PM  Result Value Ref Range   WBC 22.9 (H) 4.0 - 10.5 K/uL   RBC 2.78 (L) 4.22 - 5.81 MIL/uL   Hemoglobin 9.1 (L) 13.0 - 17.0 g/dL   HCT 31.4 (L) 39.0 - 52.0 %   MCV 112.9 (H) 80.0 - 100.0 fL   MCH 32.7 26.0 - 34.0 pg   MCHC 29.0 (L) 30.0 - 36.0 g/dL   RDW 17.5 (H) 11.5 - 15.5 %   Platelets 168 150 - 400 K/uL   nRBC 0.3 (H) 0.0 - 0.2 %    Comment: Performed at [facility], [place] [address] [place], [place] [address]  ABO/Rh     Status: None   Collection Time: 05/29/2020 12:05 AM  Result Value Ref Range   ABO/RH(D)      O POS Performed at [facility], [place] [address], [place], [place] [address]   CBC     Status: Abnormal   Collection Time: 29-May-2020  2:06 AM  Result Value Ref Range   WBC 22.5 (H) 4.0 - 10.5 K/uL   RBC 2.25 (L) 4.22 - 5.81 MIL/uL   Hemoglobin 7.5 (L) 13.0 - 17.0 g/dL   HCT 25.7 (L) 39.0 - 52.0 %   MCV 114.2 (H) 80.0 - 100.0 fL   MCH 33.3 26.0 - 34.0 pg   MCHC 29.2 (L) 30.0 - 36.0 g/dL   RDW 17.8 (H) 11.5 - 15.5 %   Platelets 128 (L) 150 - 400 K/uL   nRBC 0.3 (H) 0.0 - 0.2 %    Comment: Performed at [facility], [place] [address] [place], [place] [address]  Prepare RBC (crossmatch)     Status: None   Collection Time: 05-29-2020  3:30 AM  Result Value Ref  Range   Order Confirmation      ORDER PROCESSED BY BLOOD BANK Performed at [facility], [place] [address] [place], [place] [address]    DG Chest Port 1 View  Result Date: 04/28/2020 CLINICAL DATA:  Near syncope, dizziness, bradycardia, history of lung cancer EXAM: PORTABLE CHEST 1 VIEW COMPARISON:  05/14/2020 FINDINGS: Single frontal view of the chest demonstrates a stable cardiac silhouette. Multifocal bilateral airspace disease is again identified, left greater than right, without significant change since recent chest x-ray and CT. Small left pleural effusion unchanged. No pneumothorax. No acute bony abnormalities. IMPRESSION: 1. Persistent multifocal bilateral airspace disease. This likely reflects multifocal pneumonia, though close follow-up is warranted in a patient with a history of lung cancer to document resolution and exclude underlying recurrent neoplasm. 2. Stable left pleural effusion. Electronically Signed   By: Randa Ngo M.D.   On: 04/30/2020 22:30    Pending Labs Unresulted  Labs (From admission, onward)          Start     Ordered   May 30, 2020 0500  Creatinine, serum  Daily,   R      05-30-20 0235   05-30-20 0206  CBC  Now then every 4 hours,   R (with STAT occurrences)      05/30/20 0207   2020/05/30 0100  Protime-INR  Once,   R        30-May-2020 0100   May 30, 2020 0100  APTT  Once,   R        2020-05-30 0100   05/11/2020 2302  CBC WITH DIFFERENTIAL  (Septic presentation on arrival (screening labs, nursing and treatment orders for obvious sepsis))  ONCE - STAT,   STAT        04/21/2020 2303   05/13/2020 2302  Blood Culture (routine x 2)  (Septic presentation on arrival (screening labs, nursing and treatment orders for obvious sepsis))  BLOOD CULTURE X 2,   STAT      05/02/2020 2303   05/07/2020 2302  Urine culture  (Septic presentation on arrival (screening labs, nursing and treatment orders for obvious sepsis))  ONCE - STAT,   STAT        05/04/2020 2303           Vitals/Pain Today's Vitals   May 30, 2020 0435 2020-05-30 0443 2020/05/30 0445 05/30/2020 0530  BP: (!) 101/56 117/90 (!) 82/55 (!) 132/56  Pulse: 87 87 89 87  Resp: 18 18 20 14   Temp: 98 F (36.7 C) 98 F (36.7 C) 97.6 F (36.4 C) 98.2 F (36.8 C)  TempSrc:      SpO2: 100% 100% 98% 100%    Isolation Precautions No active isolations  Medications Medications  lactated ringers infusion ( Intravenous New Bag/Given May 30, 2020 0010)  acetaminophen (TYLENOL) tablet 650 mg (has no administration in time range)    Or  acetaminophen (TYLENOL) suppository 650 mg (has no administration in time range)  pantoprazole (PROTONIX) injection 40 mg (has no administration in time range)  levothyroxine (SYNTHROID, LEVOTHROID) injection 37.5 mcg (has no administration in time range)  piperacillin-tazobactam (ZOSYN) IVPB 3.375 g (has no administration in time range)  vancomycin (VANCOCIN) IVPB 1000 mg/200 mL premix (has no administration in time range)  sodium chloride 0.9 % bolus 500 mL (0 mLs Intravenous Stopped 05/14/2020 2255)  alum & mag hydroxide-simeth (MAALOX/MYLANTA) 200-200-20 MG/5ML suspension 30 mL (30 mLs Oral Given 05/13/2020 2347)  lactated ringers bolus 1,500 mL (0 mLs Intravenous Stopped 05-30-20 0122)  ceFEPIme (MAXIPIME) 2 g in sodium chloride 0.9 % 100 mL IVPB (0 g Intravenous Stopped 05/30/20 0032)  vancomycin (VANCOREADY) IVPB 2000 mg/400 mL (0 mg Intravenous Stopped May 30, 2020 0205)  pantoprazole (PROTONIX) injection 40 mg (40 mg Intravenous Given 05/30/20 0119)  0.9 %  sodium chloride infusion (Manually program via Guardrails IV Fluids) ( Intravenous New Bag/Given 05/30/2020 0434)    Mobility non-ambulatory

## 2020-05-21 NOTE — H&P (Signed)
History and Physical    DEVON PRETTY DDU:[id number] DOB: 01-01-1934 DOA: 05/06/2020  PCP: Leonard Downing, MD   Patient coming from: Home.  History obtained from patient's daughter and grandson.  Patient is lethargic at this time.  Chief Complaint: Loss of consciousness.  HPI: TOBIAH Cherry is a 87 y.o. male with history of stage IV adenocarcinoma of the lung recently admitted for sepsis with enterococcal bacteremia over the last couple of days was found to be getting frequent episodes of hypoxia and tachycardia and last evening while sitting on the commode patient became unresponsive hypotensive and EMS was called.  After lying flat on the floor patient became more alert awake EMS found patient was hypotensive.  Was brought to the ER.  Last couple of days patient has been having dark stools.  ED Course: In the ER patient was hypotensive and also hypothermic with temperatures around 93 F.  While in the ER patient also had large bloody bowel movement.  Patient's hemoglobin is around 9.  Patient also was started on fluids empiric antibiotics for possible developing sepsis.  Tonics for GI bleed.  Chest x-ray shows multifocal pneumonia COVID test is negative.  EKG normal sinus rhythm lactic acid 8.  At this time after discussion with patient's family plan is to continue with antibiotics fluids and if needed blood transfusion.  If patient does not make much recovery then patient will be made comfort measures.  Review of Systems: As per HPI, rest all negative.   Past Medical History:  Diagnosis Date  . Arthritis   . Asthma    as a child  . Blood in urine   . Cancer ([place])   . Chronic kidney disease    rt ureteral calculus  . Constipation   . Cough   . Difficulty urinating   . Easy bruising   . Hearing loss   . Hyperparathyroidism   . Hypertension    Denies any other heart  conditions, no chest pain or arrtthy.  . Nasal congestion   . Trouble swallowing   . Wheezing      Past Surgical History:  Procedure Laterality Date  . CYSTOSCOPY W/ URETERAL STENT PLACEMENT  2009   with stone removal  . LITHOTRIPSY  2010, 2012    two procedures performed  . tumor on thumb  30 yr ago     reports that he quit smoking about 26 years ago. His smoking use included cigarettes. He quit after 50.00 years of use. He has never used smokeless tobacco. He reports current alcohol use. He reports that he does not use drugs.  Allergies  Allergen Reactions  . Diphenhydramine Hives, Itching and Nausea Only    All over the body   . Septra [Sulfamethoxazole W/Trimethoprim (Co-Trimoxazole)] Hives, Itching and Nausea Only    All over the body  . Moxifloxacin Hcl     Dizziness, weakness    Family History  Problem Relation Age of Onset  . Cancer Sister     Prior to Admission medications   Medication Sig Start Date End Date Taking? Authorizing Provider  albuterol (PROVENTIL HFA;VENTOLIN HFA) 108 (90 Base) MCG/ACT inhaler Inhale 2 puffs into the lungs every 6 (six) hours as needed for wheezing or shortness of breath.    [provider]  amoxicillin (AMOXIL) 500 MG capsule Take 2 capsules (1,000 mg total) by mouth 3 (three) times daily for 12 days. 05/17/20 05/29/20  Dwyane Dee, MD  aspirin EC 81 MG tablet Take  81 mg by mouth daily. Swallow whole.    [provider]  Dextromethorphan-guaiFENesin (ROBAFEN DM PO) Take 20 mLs by mouth every 4 (four) hours.    [provider]  diltiazem (DILACOR XR) 180 MG 24 hr capsule Take 180 mg by mouth daily.    [provider]  hydrochlorothiazide (MICROZIDE) 12.5 MG capsule Take 12.5 mg by mouth daily. 12/22/19   [provider]  levothyroxine (SYNTHROID, LEVOTHROID) 75 MCG tablet Take 75 mcg by mouth daily before breakfast.    [provider]  Vitamin D, Ergocalciferol, (DRISDOL) 1.25 MG (50000 UNIT) CAPS capsule Take 50,000 Units by mouth every 7 (seven) days.    [provider]     Physical Exam: Constitutional: Moderately built and nourished. Vitals:   06-15-2020 0100 06/15/2020 0115 06-15-2020 0121 06-15-2020 0145  BP: 99/69 (!) 90/40 (!) 110/91 (!) 103/48  Pulse: 85 80 92 83  Resp: 16 13 18 18   Temp: (!) 96.5 F (35.8 C) (!) 96.5 F (35.8 C) (!) 96.6 F (35.9 C) (!) 96.8 F (36 C)  TempSrc:      SpO2: 98% 100% 100% 96%   Eyes: Anicteric no pallor. ENMT: No discharge from the ears eyes nose or mouth. Neck: No mass felt.  No neck rigidity. Respiratory: No rhonchi or crepitations. Cardiovascular: S1-S2 heard. Abdomen: Soft nontender bowel sounds present. Musculoskeletal: No edema. Skin: No rash. Neurologic: Patient is lethargic but easily arousable. Psychiatric: Lethargic.   Labs on Admission: I have personally reviewed following labs and imaging studies  CBC: Recent Labs  Lab 05/14/20 0850 05/14/20 1142 05/15/20 0751 05/16/20 0500 05/17/20 0444 04/21/2020 2359  WBC 18.6* 29.7* 21.3* 22.6* 20.8* 22.9*  NEUTROABS 17.9*  --   --   --   --   --   HGB 11.7* 10.8* 10.0* 10.7* 10.2* 9.1*  HCT 39.3 35.3* 33.5* 35.1* 34.4* 31.4*  MCV 107.7* 107.0* 109.1* 107.3* 108.9* 112.9*  PLT 225 205 175 200 179 811   Basic Metabolic Panel: Recent Labs  Lab 05/14/20 0850 05/14/20 1142 05/15/20 0751 05/16/20 0500 05/17/20 0444 04/21/2020 2359  NA 144  --  144 144 143 143  K 3.1*  --  3.8 3.3* 3.4* 4.3  CL 102  --  105 105 103 106  CO2 30  --  31 33* 35* 26  GLUCOSE 112*  --  168* 181* 177* 119*  BUN 18  --  23 28* 32* 47*  CREATININE 1.37* 1.39* 1.33* 1.04 1.28* 1.52*  CALCIUM 10.6*  --  10.3 10.7* 10.2 9.7   GFR: Estimated Creatinine Clearance: 38.1 mL/min (A) (by C-G formula based on SCr of 1.52 mg/dL (H)). Liver Function Tests: Recent Labs  Lab 05/14/20 0850 05/15/20 0751  AST 23 20  ALT 20 18  ALKPHOS 94 81  BILITOT 0.9 0.9  PROT 6.3* 5.8*  ALBUMIN 2.8* 2.4*   No results for input(s): LIPASE, AMYLASE in the last 168 hours. No results for  input(s): AMMONIA in the last 168 hours. Coagulation Profile: Recent Labs  Lab 05/14/20 0850 04/22/2020 2302  INR 1.1 1.6*   Cardiac Enzymes: No results for input(s): CKTOTAL, CKMB, CKMBINDEX, TROPONINI in the last 168 hours. BNP (last 3 results) No results for input(s): PROBNP in the last 8760 hours. HbA1C: No results for input(s): HGBA1C in the last 72 hours. CBG: Recent Labs  Lab 05/08/2020 2210  GLUCAP 137*   Lipid Profile: No results for input(s): CHOL, HDL, LDLCALC, TRIG, CHOLHDL, LDLDIRECT in the last 72  hours. Thyroid Function Tests: No results for input(s): TSH, T4TOTAL, FREET4, T3FREE, THYROIDAB in the last 72 hours. Anemia Panel: No results for input(s): VITAMINB12, FOLATE, FERRITIN, TIBC, IRON, RETICCTPCT in the last 72 hours. Urine analysis:    Component Value Date/Time   COLORURINE YELLOW 04/24/2020 2106   APPEARANCEUR CLOUDY (A) 05/16/2020 2106   LABSPEC 1.014 05/08/2020 2106   PHURINE 6.0 05/10/2020 2106   GLUCOSEU 50 (A) 04/26/2020 2106   HGBUR SMALL (A) 05/17/2020 2106   BILIRUBINUR NEGATIVE 05/10/2020 2106   [place] NEGATIVE 05/01/2020 2106   PROTEINUR 100 (A) 05/14/2020 2106   NITRITE NEGATIVE 04/26/2020 2106   LEUKOCYTESUR SMALL (A) 04/30/2020 2106   Sepsis Labs: @LABRCNTIP (procalcitonin:4,lacticidven:4) ) Recent Results (from the past 240 hour(s))  Resp Panel by RT-PCR (Flu A&B, Covid) Nasopharyngeal Swab     Status: None   Collection Time: 05/14/20  8:50 AM   Specimen: Nasopharyngeal Swab; Nasopharyngeal(NP) swabs in vial transport medium  Result Value Ref Range Status   SARS Coronavirus 2 by RT PCR NEGATIVE NEGATIVE Final    Comment: (NOTE) SARS-CoV-2 target nucleic acids are NOT DETECTED.  The SARS-CoV-2 RNA is generally detectable in upper respiratory specimens during the acute phase of infection. The lowest concentration of SARS-CoV-2 viral copies this assay can detect is 138 copies/mL. A negative result does not preclude  SARS-Cov-2 infection and should not be used as the sole basis for treatment or other patient management decisions. A negative result may occur with  improper specimen collection/handling, submission of specimen other than nasopharyngeal swab, presence of viral mutation(s) within the areas targeted by this assay, and inadequate number of viral copies(<138 copies/mL). A negative result must be combined with clinical observations, patient history, and epidemiological information. The expected result is Negative.  Fact Sheet for Patients:  EntrepreneurPulse.com.au  Fact Sheet for Healthcare Providers:  IncredibleEmployment.be  This test is no t yet approved or cleared by the [place] FDA and  has been authorized for detection and/or diagnosis of SARS-CoV-2 by FDA under an Emergency Use Authorization (EUA). This EUA will remain  in effect (meaning this test can be used) for the duration of the COVID-19 declaration under Section 564(b)(1) of the Act, 21 U.S.C.section 360bbb-3(b)(1), unless the authorization is terminated  or revoked sooner.       Influenza A by PCR NEGATIVE NEGATIVE Final   Influenza B by PCR NEGATIVE NEGATIVE Final    Comment: (NOTE) The Xpert Xpress SARS-CoV-2/FLU/RSV plus assay is intended as an aid in the diagnosis of influenza from Nasopharyngeal swab specimens and should not be used as a sole basis for treatment. Nasal washings and aspirates are unacceptable for Xpert Xpress SARS-CoV-2/FLU/RSV testing.  Fact Sheet for Patients: EntrepreneurPulse.com.au  Fact Sheet for Healthcare Providers: IncredibleEmployment.be  This test is not yet approved or cleared by the [place] FDA and has been authorized for detection and/or diagnosis of SARS-CoV-2 by FDA under an Emergency Use Authorization (EUA). This EUA will remain in effect (meaning this test can be used) for the duration of  the COVID-19 declaration under Section 564(b)(1) of the Act, 21 U.S.C. section 360bbb-3(b)(1), unless the authorization is terminated or revoked.  Performed at [facility], [place] [address], [place], [place] [address]   Culture, blood (routine x 2)     Status: Abnormal   Collection Time: 05/14/20  8:50 AM   Specimen: BLOOD  Result Value Ref Range Status   Specimen Description   Final    BLOOD RIGHT ANTECUBITAL Performed at [facility]  [place] [address] [place], [place] [address]    Special Requests   Final    BOTTLES DRAWN AEROBIC AND ANAEROBIC Blood Culture results may not be optimal due to an inadequate volume of blood received in culture bottles Performed at [place] [address], [place], [place] [address]    Culture  Setup Time   Final    GRAM POSITIVE COCCI IN CHAINS IN BOTH AEROBIC AND ANAEROBIC BOTTLES CRITICAL VALUE NOTED.  VALUE IS CONSISTENT WITH PREVIOUSLY REPORTED AND CALLED VALUE.    Culture (A)  Final    ENTEROCOCCUS FAECALIS SUSCEPTIBILITIES PERFORMED ON PREVIOUS CULTURE WITHIN THE LAST 5 DAYS. Performed at [facility], [place] [address] [place], [place] [address]    Report Status 05/17/2020 FINAL  Final  Culture, blood (routine x 2)     Status: Abnormal   Collection Time: 05/14/20  8:50 AM   Specimen: BLOOD  Result Value Ref Range Status   Specimen Description   Final    BLOOD LEFT ANTECUBITAL Performed at [place] [address], [place], [place] [address]    Special Requests   Final    BOTTLES DRAWN AEROBIC AND ANAEROBIC Blood Culture results may not be optimal due to an inadequate volume of blood received in culture bottles Performed at [place] [address] [place], [place] [address]    Culture  Setup Time   Final    GRAM POSITIVE COCCI IN BOTH AEROBIC AND ANAEROBIC BOTTLES CRITICAL RESULT CALLED TO, READ BACK BY AND VERIFIED WITH:  PHARMD M BELL [id number] AT 71 AM  BY CM Performed at [facility], [place] [address], [place], [place] [address]    Culture ENTEROCOCCUS FAECALIS (A)  Final   Report Status 05/17/2020 FINAL  Final   Organism ID, Bacteria ENTEROCOCCUS FAECALIS  Final      Susceptibility   Enterococcus faecalis - MIC*    AMPICILLIN <=2 SENSITIVE Sensitive     VANCOMYCIN 2 SENSITIVE Sensitive     GENTAMICIN SYNERGY SENSITIVE Sensitive     * ENTEROCOCCUS FAECALIS  Blood Culture ID Panel (Reflexed)     Status: Abnormal   Collection Time: 05/14/20  8:50 AM  Result Value Ref Range Status   Enterococcus faecalis DETECTED (A) NOT DETECTED Final    Comment: CRITICAL RESULT CALLED TO, READ BACK BY AND VERIFIED WITH: PHARMD M BELL [id number] AT 807 AM BY CM    Enterococcus Faecium NOT DETECTED NOT DETECTED Final   Listeria monocytogenes NOT DETECTED NOT DETECTED Final   Staphylococcus species NOT DETECTED NOT DETECTED Final   Staphylococcus aureus (BCID) NOT DETECTED NOT DETECTED Final   Staphylococcus epidermidis NOT DETECTED NOT DETECTED Final   Staphylococcus lugdunensis NOT DETECTED NOT DETECTED Final   Streptococcus species NOT DETECTED NOT DETECTED Final   Streptococcus agalactiae NOT DETECTED NOT DETECTED Final   Streptococcus pneumoniae NOT DETECTED NOT DETECTED Final   Streptococcus pyogenes NOT DETECTED NOT DETECTED Final   A.calcoaceticus-baumannii NOT DETECTED NOT DETECTED Final   Bacteroides fragilis NOT DETECTED NOT DETECTED Final   Enterobacterales NOT DETECTED NOT DETECTED Final   Enterobacter cloacae complex NOT DETECTED NOT DETECTED Final   Escherichia coli NOT DETECTED NOT DETECTED Final   Klebsiella aerogenes NOT DETECTED NOT DETECTED Final   Klebsiella oxytoca NOT DETECTED NOT DETECTED Final   Klebsiella pneumoniae NOT DETECTED NOT DETECTED Final   Proteus species NOT DETECTED NOT DETECTED Final   Salmonella species NOT  DETECTED NOT DETECTED Final   Serratia marcescens NOT DETECTED NOT  DETECTED Final   Haemophilus influenzae NOT DETECTED NOT DETECTED Final   Neisseria meningitidis NOT DETECTED NOT DETECTED Final   Pseudomonas aeruginosa NOT DETECTED NOT DETECTED Final   Stenotrophomonas maltophilia NOT DETECTED NOT DETECTED Final   Candida albicans NOT DETECTED NOT DETECTED Final   Candida auris NOT DETECTED NOT DETECTED Final   Candida glabrata NOT DETECTED NOT DETECTED Final   Candida krusei NOT DETECTED NOT DETECTED Final   Candida parapsilosis NOT DETECTED NOT DETECTED Final   Candida tropicalis NOT DETECTED NOT DETECTED Final   Cryptococcus neoformans/gattii NOT DETECTED NOT DETECTED Final   Vancomycin resistance NOT DETECTED NOT DETECTED Final    Comment: Performed at [facility], [place] [address] [place], [place] [address]  Urine culture     Status: Abnormal   Collection Time: 05/14/20 10:58 AM   Specimen: Urine, Clean Catch  Result Value Ref Range Status   Specimen Description   Final    URINE, CLEAN CATCH Performed at [facility], [place] [address] [place], [place] [address]    Special Requests   Final    NONE Performed at [facility], [place] [address], [place], [place] [address]    Culture 70,000 COLONIES/mL ENTEROCOCCUS FAECALIS (A)  Final   Report Status 05/17/2020 FINAL  Final   Organism ID, Bacteria ENTEROCOCCUS FAECALIS (A)  Final      Susceptibility   Enterococcus faecalis - MIC*    AMPICILLIN <=2 SENSITIVE Sensitive     NITROFURANTOIN <=16 SENSITIVE Sensitive     VANCOMYCIN 2 SENSITIVE Sensitive     * 70,000 COLONIES/mL ENTEROCOCCUS FAECALIS  MRSA PCR Screening     Status: None   Collection Time: 05/14/20 12:39 PM   Specimen: Nasopharyngeal  Result Value Ref Range Status   MRSA by PCR NEGATIVE NEGATIVE Final    Comment:        The GeneXpert MRSA Assay (FDA approved for NASAL specimens only), is one component of a comprehensive MRSA colonization surveillance program. It is not intended to  diagnose MRSA infection nor to guide or monitor treatment for MRSA infections. Performed at [facility], [place] [address] [place],  [address]   Respiratory (~20 pathogens) panel by PCR     Status: None   Collection Time: 05/14/20  8:00 PM  Result Value Ref Range Status   Adenovirus NOT DETECTED NOT DETECTED Final   Coronavirus 229E NOT DETECTED NOT DETECTED Final    Comment: (NOTE) The Coronavirus on the Respiratory Panel, DOES NOT test for the novel  Coronavirus (2019 nCoV)    Coronavirus HKU1 NOT DETECTED NOT DETECTED Final   Coronavirus NL63 NOT DETECTED NOT DETECTED Final   Coronavirus OC43 NOT DETECTED NOT DETECTED Final   Metapneumovirus NOT DETECTED NOT DETECTED Final   Rhinovirus / Enterovirus NOT DETECTED NOT DETECTED Final   Influenza A NOT DETECTED NOT DETECTED Final   Influenza B NOT DETECTED NOT DETECTED Final   Parainfluenza Virus 1 NOT DETECTED NOT DETECTED Final   Parainfluenza Virus 2 NOT DETECTED NOT DETECTED Final   Parainfluenza Virus 3 NOT DETECTED NOT DETECTED Final   Parainfluenza Virus 4 NOT DETECTED NOT DETECTED Final   Respiratory Syncytial Virus NOT DETECTED NOT DETECTED Final   Bordetella pertussis NOT DETECTED NOT DETECTED Final   Bordetella Parapertussis NOT DETECTED NOT DETECTED Final   Chlamydophila pneumoniae NOT DETECTED NOT DETECTED Final   Mycoplasma pneumoniae NOT  DETECTED NOT DETECTED Final    Comment: Performed at [facility], [place] [address] [place], [place] [address]  Culture, blood (Routine X 2) w Reflex to ID Panel     Status: None (Preliminary result)   Collection Time: 05/15/20 10:18 AM   Specimen: BLOOD  Result Value Ref Range Status   Specimen Description   Final    BLOOD LEFT ANTECUBITAL Performed at [place] [address], [place], [place] [address]    Special Requests   Final    BOTTLES DRAWN AEROBIC AND ANAEROBIC Blood Culture adequate volume Performed at [place] [address], [place], [place] [address]    Culture   Final    NO GROWTH 4 DAYS Performed at [facility], [place] [address], [place], [place] [address]    Report Status PENDING  Incomplete  Culture, blood (Routine X 2) w Reflex to ID Panel     Status: None (Preliminary result)   Collection Time: 05/15/20 10:19 AM   Specimen: BLOOD RIGHT HAND  Result Value Ref Range Status   Specimen Description   Final    BLOOD RIGHT HAND Performed at [place] [address], [place], [place] [address]    Special Requests   Final    BOTTLES DRAWN AEROBIC ONLY Blood Culture adequate volume Performed at [place] [facility] [place] [address], [place], [place] [address]    Culture   Final    NO GROWTH 4 DAYS Performed at [facility], [place] [address] [place], [place] [address]    Report Status PENDING  Incomplete  Expectorated Sputum Assessment w Gram Stain, Rflx to Resp Cult     Status: None   Collection Time: 05/15/20 11:40 AM   Specimen: Expectorated Sputum  Result Value Ref Range Status   Specimen Description EXPSU  Final   Special Requests NONE  Final   Sputum evaluation   Final    THIS SPECIMEN IS ACCEPTABLE FOR SPUTUM CULTURE Performed at [facility], [place] [address], [place], [place] [address]    Report Status 05/15/2020 FINAL  Final  Culture, Respiratory w Gram Stain     Status: None   Collection Time: 05/15/20 11:40 AM  Result Value Ref Range Status   Specimen Description   Final    EXPSU Performed at [facility], [place] [address] [place], [place] [address]    Special Requests   Final    NONE Reflexed from [id number] Performed at [facility], [place] [address], [place], [place] [address]    Gram Stain   Final    RARE WBC PRESENT, PREDOMINANTLY PMN ABUNDANT SQUAMOUS EPITHELIAL CELLS PRESENT ABUNDANT YEAST ABUNDANT GRAM POSITIVE RODS ABUNDANT GRAM VARIABLE ROD     Culture   Final    FEW Normal respiratory flora-no Staph aureus or Pseudomonas seen Performed at [facility], [address] [place], [place] [address]    Report Status 05/17/2020 FINAL  Final  Resp Panel by RT-PCR (Flu A&B, Covid) Nasopharyngeal Swab     Status: None   Collection Time: 05/11/2020 11:02 PM   Specimen: Nasopharyngeal Swab; Nasopharyngeal(NP) swabs in vial transport medium  Result Value Ref Range Status   SARS Coronavirus 2 by RT PCR NEGATIVE NEGATIVE Final    Comment: (NOTE) SARS-CoV-2 target nucleic acids are NOT DETECTED.  The SARS-CoV-2 RNA is generally detectable in upper respiratory specimens during the acute phase of  infection. The lowest concentration of SARS-CoV-2 viral copies this assay can detect is 138 copies/mL. A negative result does not preclude SARS-Cov-2 infection and should not be used as the sole basis for treatment or other patient management decisions. A negative result may occur with  improper specimen collection/handling, submission of specimen other than nasopharyngeal swab, presence of viral mutation(s) within the areas targeted by this assay, and inadequate number of viral copies(<138 copies/mL). A negative result must be combined with clinical observations, patient history, and epidemiological information. The expected result is Negative.  Fact Sheet for Patients:  EntrepreneurPulse.com.au  Fact Sheet for Healthcare Providers:  IncredibleEmployment.be  This test is no t yet approved or cleared by the [place] FDA and  has been authorized for detection and/or diagnosis of SARS-CoV-2 by FDA under an Emergency Use Authorization (EUA). This EUA will remain  in effect (meaning this test can be used) for the duration of the COVID-19 declaration under Section 564(b)(1) of the Act, 21 U.S.C.section 360bbb-3(b)(1), unless the authorization is terminated  or revoked sooner.       Influenza A by  PCR NEGATIVE NEGATIVE Final   Influenza B by PCR NEGATIVE NEGATIVE Final    Comment: (NOTE) The Xpert Xpress SARS-CoV-2/FLU/RSV plus assay is intended as an aid in the diagnosis of influenza from Nasopharyngeal swab specimens and should not be used as a sole basis for treatment. Nasal washings and aspirates are unacceptable for Xpert Xpress SARS-CoV-2/FLU/RSV testing.  Fact Sheet for Patients: EntrepreneurPulse.com.au  Fact Sheet for Healthcare Providers: IncredibleEmployment.be  This test is not yet approved or cleared by the [place] FDA and has been authorized for detection and/or diagnosis of SARS-CoV-2 by FDA under an Emergency Use Authorization (EUA). This EUA will remain in effect (meaning this test can be used) for the duration of the COVID-19 declaration under Section 564(b)(1) of the Act, 21 U.S.C. section 360bbb-3(b)(1), unless the authorization is terminated or revoked.  Performed at [facility], [place] [address], [place], [place] [address]      Radiological Exams on Admission: DG Chest Port 1 View  Result Date: 05/17/2020 CLINICAL DATA:  Near syncope, dizziness, bradycardia, history of lung cancer EXAM: PORTABLE CHEST 1 VIEW COMPARISON:  05/14/2020 FINDINGS: Single frontal view of the chest demonstrates a stable cardiac silhouette. Multifocal bilateral airspace disease is again identified, left greater than right, without significant change since recent chest x-ray and CT. Small left pleural effusion unchanged. No pneumothorax. No acute bony abnormalities. IMPRESSION: 1. Persistent multifocal bilateral airspace disease. This likely reflects multifocal pneumonia, though close follow-up is warranted in a patient with a history of lung cancer to document resolution and exclude underlying recurrent neoplasm. 2. Stable left pleural effusion. Electronically Signed   By: Randa Ngo M.D.   On: 05/04/2020 22:30     EKG: Independently reviewed.  Normal sinus rhythm.  Assessment/Plan Principal Problem:   Acute GI bleeding Active Problems:   Stage 4 lung cancer (HCC)   Sepsis ([place])   COPD (chronic obstructive pulmonary disease) ([facility])   DNR (do not resuscitate)    1. Possible developing sepsis could be from multifocal pneumonia with recent admission for enterococcal bacteremia -we will keep patient on empiric antibiotics follow cultures continue IV fluids. 2. Acute GI bleed -patient is on Protonix IV check serial CBC transfuse if hemoglobin decreases less than 7 or becomes hypotensive.  Patient's family does not want any interventions or procedures. 3. Hypothermia likely from sepsis.  Presently on [facility]. 4. Stage IV  lung cancer. 5. Hypothyroidism presently on IV Synthroid. 6. COPD not actively wheezing. 7. Syncope likely from hypotensive episode possibly from GI bleed.  Plan at this time after discussing with patient's daughter and grandson is to continue antibiotics and fluids transfuse if there is drop in hemoglobin less than 7 but if patient does not make any reasonable recovery or there is any further decline then patient will be made comfort measures.  Since patient is septic and having acute GI bleed will need close monitoring and inpatient status.   DVT prophylaxis: SCDs.  Avoiding anticoagulation in the setting of GI bleed. Code Status: DNR. Family Communication: Patient's daughter and grandson. Disposition Plan: To be determined.  Could be terminal. Consults called: Palliative care. Admission status: Inpatient.   Rise Patience MD [facility] Pager [phone].  If 7PM-7AM, please contact night-coverage [email or web address] Password [email or web address]  Jun 05, 2020, 2:08 AM

## 2020-05-21 DEATH — deceased

## 2020-05-22 ENCOUNTER — Other Ambulatory Visit: Payer: Self-pay | Admitting: Student

## 2020-05-25 LAB — CULTURE, BLOOD (ROUTINE X 2)
Culture: NO GROWTH
Culture: NO GROWTH

## 2020-06-21 NOTE — Death Summary Note (Signed)
DEATH SUMMARY   Patient Details  Name: Alex Cherry MRN: [id number] DOB: 1933/06/16  Admission/Discharge Information   Admit Date:  06-02-20  Date of Death: Date of Death: Jun 03, 2020  Time of Death: Time of Death: 15  Length of Stay: 1  Referring Physician: Leonard Downing, MD   Reason(s) for Hospitalization  Sepsis  Diagnoses  Preliminary cause of death:  Secondary Diagnoses (including complications and co-morbidities):  Principal Problem:   Sepsis ([place]) Active Problems:   Stage 4 lung cancer ([place])   COPD (chronic obstructive pulmonary disease) ([place])   Acute GI bleeding   DNR (do not resuscitate)   Comfort measures only status  Sepsis Present on admission in setting of multifocal pneumonia and previously known enterococcal bacteremia. Empiric vancomycin and cefepime IV initiated on admission prior to transition to comfort measures. Upon transition to comfort measures, morphine drip initiated by palliative care medicine.  Acute GI bleed No workup pursued secondary to patient's tenuous state. Supportive care with IV fluids.  Hypothermia Secondary to sepsis.  Hypothyroidism Continued synthroid  Stage IV lung cancer Currently enrolled with hospice.  Syncope Possibly secondary to GI bleeding/Sepsis.  Brief Hospital Course (including significant findings, care, treatment, and services provided and events leading to death)  Alex Cherry is a 87 y.o. year old male who male with history of stage IV lung cancer, recent enterococcal bacteremia on antibiotics, COPD.  Patient presented secondary to loss of consciousness and was found to have findings concerning for sepsis.  Empiric antibiotics initiated on admission prior to patient transitioning to full comfort measures.    Pertinent Labs and Studies  Significant Diagnostic Studies CT Angio Chest PE W and/or Wo Contrast  Result Date: 05/14/2020 CLINICAL DATA:  PE suspected.  History of lung cancer. EXAM: CT  ANGIOGRAPHY CHEST WITH CONTRAST TECHNIQUE: Multidetector CT imaging of the chest was performed using the standard protocol during bolus administration of intravenous contrast. Multiplanar CT image reconstructions and MIPs were obtained to evaluate the vascular anatomy. CONTRAST:  3mL OMNIPAQUE IOHEXOL 350 MG/ML SOLN COMPARISON:  08/15/2017 FINDINGS: Cardiovascular: Satisfactory opacification of the pulmonary arteries to the segmental level. No evidence of pulmonary embolism. Normal heart size. No pericardial effusion. Aortic atherosclerosis. Mediastinum/Nodes: No discrete thyroid nodule. Retained secretions are identified within the trachea. The esophagus is unremarkable. Prominent mediastinal lymph nodes are noted, none of which meet CT criteria for adenopathy. No hilar adenopathy. Lungs/Pleura: Small bilateral pleural effusions are identified, left greater right. Moderate to advanced changes of paraseptal and centrilobular emphysema with diffuse bronchial wall thickening. Bilateral multifocal geographic areas of airspace consolidation identified along with diffuse ground-glass attenuation. In the acute setting findings are likely secondary to multifocal pneumonia. Underlying recurrent tumor in this patient who has a history of stage IV lung cancer would be difficult to exclude. Upper Abdomen: No acute abnormality. Left upper pole kidney stones measure up to 1.0 cm. There is a new left adrenal nodule measuring 1.7 x 3.7 cm, image 293/5. Posterior right hepatic lobe hypodensity measures 1.4 cm and is not significantly changed from previous exam. Musculoskeletal: Spondylosis noted throughout the thoracic spine. No acute or suspicious osseous findings. Review of the MIP images confirms the above findings. IMPRESSION: 1. No evidence for acute pulmonary embolus. 2. Bilateral multifocal geographic areas of airspace consolidation and ground-glass attenuation are identified. In the acute setting findings are likely  secondary to multifocal pneumonia. Underlying recurrent tumor in this patient who has a history of stage IV lung cancer would be difficult to exclude.  Follow-up imaging is recommended to ensure resolution. In the absence of resolution tissue sampling and or PET CT may be helpful for further evaluation 3. New left adrenal nodule worrisome for metastatic disease. 4. Diffuse bronchial wall thickening with emphysema, as above; imaging findings suggestive of underlying COPD. 5. Emphysema and aortic atherosclerosis. Aortic Atherosclerosis (ICD10-I70.0) and Emphysema (ICD10-J43.9). Electronically Signed   By: Kerby Moors M.D.   On: 05/14/2020 12:38   DG Chest Port 1 View  Result Date: 05/07/2020 CLINICAL DATA:  Near syncope, dizziness, bradycardia, history of lung cancer EXAM: PORTABLE CHEST 1 VIEW COMPARISON:  05/14/2020 FINDINGS: Single frontal view of the chest demonstrates a stable cardiac silhouette. Multifocal bilateral airspace disease is again identified, left greater than right, without significant change since recent chest x-ray and CT. Small left pleural effusion unchanged. No pneumothorax. No acute bony abnormalities. IMPRESSION: 1. Persistent multifocal bilateral airspace disease. This likely reflects multifocal pneumonia, though close follow-up is warranted in a patient with a history of lung cancer to document resolution and exclude underlying recurrent neoplasm. 2. Stable left pleural effusion. Electronically Signed   By: Randa Ngo M.D.   On: 05/18/2020 22:30   DG Chest Port 1 View  Result Date: 05/14/2020 CLINICAL DATA:  Pt on 2.5-3L O2 continuous but has to be increased when pt gets up. Pt has lung cancer. Pt comes in on NRB. Pt was treated for lung infection 3 weeks ago but had to be taken off medication due to AMS and increased dizziness. EXAM: PORTABLE CHEST - 1 VIEW COMPARISON:  08/21/2017 FINDINGS: Interval development of extensive airspace opacities throughout both lungs, with  relative sparing in the right upper lobe, and more focal consolidation in the right mid lung. There is blunting of the left lateral costophrenic angle suggesting small effusion. Heart size remains normal. No pneumothorax. Vertebral endplate spurring at multiple levels in the mid thoracic spine. IMPRESSION: Interval development of extensive bilateral airspace infiltrates or edema. Electronically Signed   By: Lucrezia Europe M.D.   On: 05/14/2020 08:52   ECHOCARDIOGRAM COMPLETE  Result Date: 05/15/2020    ECHOCARDIOGRAM REPORT   Patient Name:   Alex Cherry Date of Exam: 05/15/2020 Medical Rec #:  [id number]       Height:       68.0 in Accession #:    [id number]      Weight:       184.5 lb Date of Birth:  1933/08/07       BSA:          1.975 m Patient Age:    53 years        BP:           173/97 mmHg Patient Gender: M               HR:           77 bpm. Exam Location:  Inpatient Procedure: 2D Echo, Cardiac Doppler, Color Doppler and Intracardiac            Opacification Agent Indications:    Acute respiratory distress. ; R06.02 SOB  History:        Patient has prior history of Echocardiogram examinations, most                 recent 08/19/2017. COPD, Signs/Symptoms:Shortness of Breath,                 Dyspnea and Dizziness/Lightheadedness; Risk  Factors:Hypertension. Metastatic lung cancer. Hypoxia. Elevated                 troponin.  Sonographer:    Roseanna Rainbow RDCS Referring Phys: [phone] [facility]  Sonographer Comments: Technically difficult study due to poor echo windows. Patient in high fowler's position. IMPRESSIONS  1. Left ventricular ejection fraction, by estimation, is 65 to 70%. The left ventricle has normal function. The left ventricle has no regional wall motion abnormalities. There is moderate concentric left ventricular hypertrophy. Left ventricular diastolic parameters are consistent with Grade I diastolic dysfunction (impaired relaxation). Elevated left ventricular end-diastolic  pressure.  2. Right ventricular systolic function is normal. The right ventricular size is normal. There is mildly elevated pulmonary artery systolic pressure.  3. The mitral valve is normal in structure. No evidence of mitral valve regurgitation. No evidence of mitral stenosis. Moderate mitral annular calcification.  4. The aortic valve is normal in structure. There is mild calcification of the aortic valve. There is mild thickening of the aortic valve. Aortic valve regurgitation is not visualized. No aortic stenosis is present.  5. The inferior vena cava is normal in size with greater than 50% respiratory variability, suggesting right atrial pressure of 3 mmHg. FINDINGS  Left Ventricle: Left ventricular ejection fraction, by estimation, is 65 to 70%. The left ventricle has normal function. The left ventricle has no regional wall motion abnormalities. Definity contrast agent was given IV to delineate the left ventricular  endocardial borders. The left ventricular internal cavity size was normal in size. There is moderate concentric left ventricular hypertrophy. Left ventricular diastolic parameters are consistent with Grade I diastolic dysfunction (impaired relaxation). Elevated left ventricular end-diastolic pressure. Right Ventricle: The right ventricular size is normal. No increase in right ventricular wall thickness. Right ventricular systolic function is normal. There is mildly elevated pulmonary artery systolic pressure. The tricuspid regurgitant velocity is 3.16  m/s, and with an assumed right atrial pressure of 3 mmHg, the estimated right ventricular systolic pressure is 09.3 mmHg. Left Atrium: Left atrial size was normal in size. Right Atrium: Right atrial size was normal in size. Pericardium: There is no evidence of pericardial effusion. Mitral Valve: The mitral valve is normal in structure. Moderate mitral annular calcification. No evidence of mitral valve regurgitation. No evidence of mitral valve  stenosis. Tricuspid Valve: The tricuspid valve is normal in structure. Tricuspid valve regurgitation is trivial. No evidence of tricuspid stenosis. Aortic Valve: The aortic valve is normal in structure. There is mild calcification of the aortic valve. There is mild thickening of the aortic valve. Aortic valve regurgitation is not visualized. No aortic stenosis is present. Pulmonic Valve: The pulmonic valve was normal in structure. Pulmonic valve regurgitation is not visualized. No evidence of pulmonic stenosis. Aorta: The aortic root is normal in size and structure. Venous: The inferior vena cava is normal in size with greater than 50% respiratory variability, suggesting right atrial pressure of 3 mmHg. IAS/Shunts: No atrial level shunt detected by color flow Doppler.  LEFT VENTRICLE PLAX 2D LVIDd:         3.30 cm     Diastology LVIDs:         1.80 cm     LV e' medial:    5.33 cm/s LV PW:         1.80 cm     LV E/e' medial:  17.3 LV IVS:        1.60 cm     LV e' lateral:   4.57  cm/s LVOT diam:     1.70 cm     LV E/e' lateral: 20.2 LV SV:         46 LV SV Index:   23 LVOT Area:     2.27 cm  LV Volumes (MOD) LV vol d, MOD A2C: 94.2 ml LV vol d, MOD A4C: 69.2 ml LV vol s, MOD A2C: 26.9 ml LV vol s, MOD A4C: 27.0 ml LV SV MOD A2C:     67.3 ml LV SV MOD A4C:     69.2 ml LV SV MOD BP:      58.2 ml RIGHT VENTRICLE             IVC RV S prime:     14.80 cm/s  IVC diam: 2.10 cm TAPSE (M-mode): 2.4 cm LEFT ATRIUM           Index       RIGHT ATRIUM           Index LA diam:      3.00 cm 1.52 cm/m  RA Area:     11.80 cm LA Vol (A2C): 19.2 ml 9.72 ml/m  RA Volume:   24.90 ml  12.61 ml/m LA Vol (A4C): 26.4 ml 13.37 ml/m  AORTIC VALVE LVOT Vmax:   95.10 cm/s LVOT Vmean:  71.800 cm/s LVOT VTI:    0.201 m  AORTA Ao Root diam: 3.40 cm Ao Asc diam:  2.90 cm MITRAL VALVE                TRICUSPID VALVE MV Area (PHT): 3.31 cm     TR Peak grad:   39.9 mmHg MV Decel Time: 229 msec     TR Vmax:        316.00 cm/s MV E velocity: 92.40  cm/s MV A velocity: 133.00 cm/s  SHUNTS MV E/A ratio:  0.69         Systemic VTI:  0.20 m                             Systemic Diam: 1.70 cm Skeet Latch MD Electronically signed by Skeet Latch MD Signature Date/Time: 05/15/2020/3:27:44 PM    Final     Microbiology Recent Results (from the past 240 hour(s))  Culture, blood (Routine X 2) w Reflex to ID Panel     Status: None   Collection Time: 05/15/20 10:18 AM   Specimen: BLOOD  Result Value Ref Range Status   Specimen Description   Final    BLOOD LEFT ANTECUBITAL Performed at [facility], [place] [address], [place], [place] [address]    Special Requests   Final    BOTTLES DRAWN AEROBIC AND ANAEROBIC Blood Culture adequate volume Performed at [place] [address], [place], [place] [address]    Culture   Final    NO GROWTH 5 DAYS Performed at [facility], [place] [address] [place], [place] [address]    Report Status 06/17/20 FINAL  Final  Culture, blood (Routine X 2) w Reflex to ID Panel     Status: None   Collection Time: 05/15/20 10:19 AM   Specimen: BLOOD RIGHT HAND  Result Value Ref Range Status   Specimen Description   Final    BLOOD RIGHT HAND Performed at [place] [address] [place], [place] [address]    Special Requests   Final    BOTTLES DRAWN AEROBIC  ONLY Blood Culture adequate volume Performed at [place] [address], [place], [place] [address]    Culture   Final    NO GROWTH 5 DAYS Performed at [facility], [place] [address], [place], [place] [address]    Report Status Jun 16, 2020 FINAL  Final  Expectorated Sputum Assessment w Gram Stain, Rflx to Resp Cult     Status: None   Collection Time: 05/15/20 11:40 AM   Specimen: Expectorated Sputum  Result Value Ref Range Status   Specimen Description EXPSU  Final   Special Requests NONE  Final   Sputum evaluation   Final    THIS SPECIMEN IS ACCEPTABLE  FOR SPUTUM CULTURE Performed at [facility], [place] [address] [place], [place] [address]    Report Status 05/15/2020 FINAL  Final  Culture, Respiratory w Gram Stain     Status: None   Collection Time: 05/15/20 11:40 AM  Result Value Ref Range Status   Specimen Description   Final    EXPSU Performed at [facility], [place] [address] [place], [place] [address]    Special Requests   Final    NONE Reflexed from [id number] Performed at [facility], [place] [address], [place], [place] [address]    Gram Stain   Final    RARE WBC PRESENT, PREDOMINANTLY PMN ABUNDANT SQUAMOUS EPITHELIAL CELLS PRESENT ABUNDANT YEAST ABUNDANT GRAM POSITIVE RODS ABUNDANT GRAM VARIABLE ROD    Culture   Final    FEW Normal respiratory flora-no Staph aureus or Pseudomonas seen Performed at [facility], [address] [place],  [address]    Report Status 05/17/2020 FINAL  Final  Resp Panel by RT-PCR (Flu A&B, Covid) Nasopharyngeal Swab     Status: None   Collection Time: 05/18/2020 11:02 PM   Specimen: Nasopharyngeal Swab; Nasopharyngeal(NP) swabs in vial transport medium  Result Value Ref Range Status   SARS Coronavirus 2 by RT PCR NEGATIVE NEGATIVE Final    Comment: (NOTE) SARS-CoV-2 target nucleic acids are NOT DETECTED.  The SARS-CoV-2 RNA is generally detectable in upper respiratory specimens during the acute phase of infection. The lowest concentration of SARS-CoV-2 viral copies this assay can detect is 138 copies/mL. A negative result does not preclude SARS-Cov-2 infection and should not be used as the sole basis for treatment or other patient management decisions. A negative result may occur with  improper specimen collection/handling, submission of specimen other than nasopharyngeal swab, presence of viral mutation(s) within the areas targeted by this assay, and inadequate number of viral copies(<138 copies/mL). A negative result must  be combined with clinical observations, patient history, and epidemiological information. The expected result is Negative.  Fact Sheet for Patients:  EntrepreneurPulse.com.au  Fact Sheet for Healthcare Providers:  IncredibleEmployment.be  This test is no t yet approved or cleared by the [place] FDA and  has been authorized for detection and/or diagnosis of SARS-CoV-2 by FDA under an Emergency Use Authorization (EUA). This EUA will remain  in effect (meaning this test can be used) for the duration of the COVID-19 declaration under Section 564(b)(1) of the Act, 21 U.S.C.section 360bbb-3(b)(1), unless the authorization is terminated  or revoked sooner.       Influenza A by PCR NEGATIVE NEGATIVE Final   Influenza B by PCR NEGATIVE NEGATIVE Final    Comment: (NOTE) The Xpert Xpress SARS-CoV-2/FLU/RSV plus assay is intended as an aid in the diagnosis of influenza from Nasopharyngeal swab specimens and  should not be used as a sole basis for treatment. Nasal washings and aspirates are unacceptable for Xpert Xpress SARS-CoV-2/FLU/RSV testing.  Fact Sheet for Patients: EntrepreneurPulse.com.au  Fact Sheet for Healthcare Providers: IncredibleEmployment.be  This test is not yet approved or cleared by the [place] FDA and has been authorized for detection and/or diagnosis of SARS-CoV-2 by FDA under an Emergency Use Authorization (EUA). This EUA will remain in effect (meaning this test can be used) for the duration of the COVID-19 declaration under Section 564(b)(1) of the Act, 21 U.S.C. section 360bbb-3(b)(1), unless the authorization is terminated or revoked.  Performed at [facility], [place] [address] [place], [place] [address]   Blood Culture (routine x 2)     Status: None (Preliminary result)   Collection Time: 04/22/2020 11:02 PM   Specimen: BLOOD  Result Value Ref Range Status    Specimen Description   Final    BLOOD RIGHT HAND Performed at [place] [address], [place], [place] [address]    Special Requests   Final    BOTTLES DRAWN AEROBIC ONLY Blood Culture results may not be optimal due to an inadequate volume of blood received in culture bottles Performed at [place] [address] [place], [place] [address]    Culture   Final    NO GROWTH 4 DAYS Performed at [facility], [place] [address], [place], [place] [address]    Report Status PENDING  Incomplete  Urine culture     Status: None   Collection Time: 05/10/2020 11:02 PM   Specimen: In/Out Cath Urine  Result Value Ref Range Status   Specimen Description   Final    IN/OUT CATH URINE Performed at [place] [address] [place], [place] [address]    Special Requests   Final    NONE Performed at [facility], [place] [address] [place], [place] [address]    Culture   Final    NO GROWTH Performed at [facility], [place] [address], [place], [place] [address]    Report Status 05/21/2020 FINAL  Final  Blood Culture (routine x 2)     Status: None (Preliminary result)   Collection Time: 09-Jun-2020 11:03 AM   Specimen: BLOOD  Result Value Ref Range Status   Specimen Description   Final    BLOOD BLOOD RIGHT FOREARM Performed at [place] [address], [place], [place] [address]    Special Requests   Final    BOTTLES DRAWN AEROBIC ONLY Blood Culture results may not be optimal due to an inadequate volume of blood received in culture bottles Performed at [place] [address] [place], [place] [address]    Culture   Final    NO GROWTH 4 DAYS Performed at [facility], [place] [address], [place], [place] [address]    Report Status PENDING  Incomplete    Lab Basic Metabolic Panel: Recent Labs  Lab 05/05/2020 2359 2020-06-09 0530  NA 143  --   K 4.3   --   CL 106  --   CO2 26  --   GLUCOSE 119*  --   BUN 47*  --   CREATININE 1.52* 1.40*  CALCIUM 9.7  --    Liver Function Tests: No results for input(s): AST, ALT, ALKPHOS, BILITOT, PROT, ALBUMIN in the last 168 hours. No results for input(s): LIPASE, AMYLASE in the last 168  hours. No results for input(s): AMMONIA in the last 168 hours. CBC: Recent Labs  Lab 05/02/2020 2359 2020/05/23 0206 05/23/2020 0530 May 23, 2020 1103  WBC 22.9* 22.5* 22.5* 18.3*  HGB 9.1* 7.5* 8.0* 9.3*  HCT 31.4* 25.7* 27.0* 30.9*  MCV 112.9* 114.2* 106.7* 100.7*  PLT 168 128* 143* 127*   Cardiac Enzymes: No results for input(s): CKTOTAL, CKMB, CKMBINDEX, TROPONINI in the last 168 hours. Sepsis Labs: Recent Labs  Lab 05/10/2020 2132 05/09/2020 2332 04/21/2020 2359 May 23, 2020 0206 23-May-2020 0530 2020-05-23 1103  PROCALCITON  --   --   --   --  3.21  --   WBC  --   --  22.9* 22.5* 22.5* 18.3*  LATICACIDVEN 3.2* 8.0*  --   --   --   --     Procedures/Operations     Cordelia Poche, MD 05/24/2020, 8:39 PM
